# Patient Record
Sex: Male | Born: 1970 | Race: Black or African American | Hispanic: No | Marital: Single | Smoking: Never smoker
Health system: Southern US, Community
[De-identification: ages and names within clinical notes are randomized; demographics above are authoritative.]

## PROBLEM LIST (undated history)

## (undated) DIAGNOSIS — E119 Type 2 diabetes mellitus without complications: Secondary | ICD-10-CM

## (undated) DIAGNOSIS — I10 Essential (primary) hypertension: Secondary | ICD-10-CM

---

## 2017-06-18 ENCOUNTER — Other Ambulatory Visit: Payer: Self-pay

## 2017-06-18 ENCOUNTER — Emergency Department (HOSPITAL_COMMUNITY): Payer: Managed Care, Other (non HMO)

## 2017-06-18 ENCOUNTER — Encounter (HOSPITAL_COMMUNITY): Payer: Self-pay | Admitting: Nurse Practitioner

## 2017-06-18 ENCOUNTER — Emergency Department (HOSPITAL_COMMUNITY)
Admission: EM | Admit: 2017-06-18 | Discharge: 2017-06-18 | Disposition: A | Discharge status: Home / Self Care | Payer: Managed Care, Other (non HMO) | Attending: Emergency Medicine | Admitting: Emergency Medicine

## 2017-06-18 DIAGNOSIS — S99911A Unspecified injury of right ankle, initial encounter: Secondary | ICD-10-CM | POA: Diagnosis present

## 2017-06-18 DIAGNOSIS — Y929 Unspecified place or not applicable: Secondary | ICD-10-CM | POA: Diagnosis not present

## 2017-06-18 DIAGNOSIS — Y999 Unspecified external cause status: Secondary | ICD-10-CM | POA: Diagnosis not present

## 2017-06-18 DIAGNOSIS — S82851A Displaced trimalleolar fracture of right lower leg, initial encounter for closed fracture: Secondary | ICD-10-CM | POA: Insufficient documentation

## 2017-06-18 DIAGNOSIS — Y939 Activity, unspecified: Secondary | ICD-10-CM | POA: Diagnosis not present

## 2017-06-18 DIAGNOSIS — W010XXA Fall on same level from slipping, tripping and stumbling without subsequent striking against object, initial encounter: Secondary | ICD-10-CM | POA: Insufficient documentation

## 2017-06-18 DIAGNOSIS — R3129 Other microscopic hematuria: Secondary | ICD-10-CM | POA: Insufficient documentation

## 2017-06-18 DIAGNOSIS — S9304XA Dislocation of right ankle joint, initial encounter: Secondary | ICD-10-CM

## 2017-06-18 DIAGNOSIS — S82899A Other fracture of unspecified lower leg, initial encounter for closed fracture: Secondary | ICD-10-CM

## 2017-06-18 LAB — URINALYSIS, ROUTINE W REFLEX MICROSCOPIC
BILIRUBIN URINE: NEGATIVE
Bacteria, UA: NONE SEEN
GLUCOSE, UA: NEGATIVE mg/dL
KETONES UR: NEGATIVE mg/dL
LEUKOCYTES UA: NEGATIVE
NITRITE: NEGATIVE
PROTEIN: NEGATIVE mg/dL
Specific Gravity, Urine: 1.02 (ref 1.005–1.030)
Squamous Epithelial / LPF: NONE SEEN
pH: 7 (ref 5.0–8.0)

## 2017-06-18 MED ORDER — PROPOFOL 10 MG/ML IV BOLUS
INTRAVENOUS | Status: AC
  Filled 2017-06-18: qty 20

## 2017-06-18 MED ORDER — ONDANSETRON HCL 4 MG/2ML IJ SOLN
4.0000 mg | Freq: Once | INTRAMUSCULAR | Status: AC
  Administered 2017-06-18: 4 mg via INTRAVENOUS
  Filled 2017-06-18: qty 2

## 2017-06-18 MED ORDER — HYDROCODONE-ACETAMINOPHEN 5-325 MG PO TABS: 1.0000 | ORAL_TABLET | Freq: Four times a day (QID) | ORAL | 0 refills | Status: AC | PRN

## 2017-06-18 MED ORDER — HYDROMORPHONE HCL 1 MG/ML IJ SOLN
1.0000 mg | Freq: Once | INTRAMUSCULAR | Status: AC
  Administered 2017-06-18: 1 mg via INTRAVENOUS
  Filled 2017-06-18: qty 1

## 2017-06-18 MED ORDER — PROPOFOL 10 MG/ML IV BOLUS
INTRAVENOUS | Status: AC | PRN
  Administered 2017-06-18 (×2): 60 mg via INTRAVENOUS

## 2017-06-18 MED ORDER — SODIUM CHLORIDE 0.9 % IV SOLN
INTRAVENOUS | Status: AC | PRN
  Administered 2017-06-18: 1000 mL via INTRAVENOUS

## 2017-06-18 MED ORDER — PROPOFOL 10 MG/ML IV BOLUS: 0.5000 mg/kg | Freq: Once | INTRAVENOUS | Status: DC

## 2017-06-18 NOTE — ED Triage Notes (Signed)
Patient brought in by EMS for right ankle deformity. He was walking in his socks and slipped. Patient did not hit his head and no LOC.

## 2017-06-18 NOTE — ED Notes (Signed)
Patient urinated using the urinal. This nurse noted blood coming from patient urethra. Patient's urine is pink tinged. EDMD notified. UA ordered.

## 2017-06-18 NOTE — ED Provider Notes (Addendum)
[facility]-EMERGENCY DEPT Provider Note   CSN: [id number] Arrival date & time: 06/18/17  [id number]     History   Chief Complaint No chief complaint on file.   HPI Benjamin Henderson is a 46 y.o. male.  Patient c/o skip and fall with pain in right ankle. Occurred this AM.  No faintness or dizziness prior to fall. Right ankle pain is constant, dull, severe, worse w movement. No knee pain. Denies any other pain or injury. Skin is intact. Denies numbness/weakness. Felt fine prior to fall.    The history is provided by the patient.    History reviewed. No pertinent past medical history.  There are no active problems to display for this patient.   History reviewed. No pertinent surgical history.     Home Medications    Prior to Admission medications   Not on File    Family History History reviewed. No pertinent family history.  Social History Social History   Tobacco Use  . Smoking status: Not on file  Substance Use Topics  . Alcohol use: Not on file  . Drug use: Not on file     Allergies   Patient has no allergy information on record.   Review of Systems Review of Systems  Constitutional: Negative for fever.  HENT: Negative for nosebleeds.   Eyes: Negative for redness.  Respiratory: Negative for shortness of breath.   Cardiovascular: Negative for chest pain.  Gastrointestinal: Negative for abdominal pain and vomiting.  Genitourinary: Negative for dysuria, flank pain, hematuria, scrotal swelling and testicular pain.  Musculoskeletal: Negative for back pain and neck pain.  Skin: Negative for wound.  Neurological: Negative for weakness, numbness and headaches.  Hematological: Does not bruise/bleed easily.  Psychiatric/Behavioral: Negative for confusion.     Physical Exam Updated Vital Signs BP 135/70 (BP Location: Right Arm)   Pulse (!) 104   Temp 98.5 F (36.9 C) (Oral)   Resp 18   SpO2 99%   Physical Exam  Constitutional: He  appears well-developed and well-nourished. No distress.  HENT:  Head: Atraumatic.  Mouth/Throat: Oropharynx is clear and moist.  Eyes: Conjunctivae are normal. Pupils are equal, round, and reactive to light.  Neck: Neck supple. No tracheal deviation present.  Cardiovascular: Normal rate, regular rhythm, normal heart sounds and intact distal pulses.  Pulmonary/Chest: Effort normal and breath sounds normal. No accessory muscle usage. No respiratory distress. He exhibits no tenderness.  Abdominal: Soft. Bowel sounds are normal. He exhibits no distension. There is no tenderness.  No abd or flank bruising or contusion.   Genitourinary:  Genitourinary Comments: No cva tenderness  Musculoskeletal: He exhibits no edema.  Neurological: He is alert.  Speech clear/fluent. Motor intact bil. sens grossly intact bil.   Skin: Skin is warm and dry. He is not diaphoretic.  Psychiatric: He has a normal mood and affect.  Nursing note and vitals reviewed.    ED Treatments / Results  Labs (all labs ordered are listed, but only abnormal results are displayed) Results for orders placed or performed during the hospital encounter of 06/18/17  Urinalysis, Routine w reflex microscopic  Result Value Ref Range   Color, Urine YELLOW YELLOW   APPearance CLEAR CLEAR   Specific Gravity, Urine 1.020 1.005 - 1.030   pH 7.0 5.0 - 8.0   Glucose, UA NEGATIVE NEGATIVE mg/dL   Hgb urine dipstick SMALL (A) NEGATIVE   Bilirubin Urine NEGATIVE NEGATIVE   Ketones, ur NEGATIVE NEGATIVE mg/dL   Protein, ur NEGATIVE NEGATIVE  mg/dL   Nitrite NEGATIVE NEGATIVE   Leukocytes, UA NEGATIVE NEGATIVE   RBC / HPF TOO NUMEROUS TO COUNT 0 - 5 RBC/hpf   WBC, UA 0-5 0 - 5 WBC/hpf   Bacteria, UA NONE SEEN NONE SEEN   Squamous Epithelial / LPF NONE SEEN NONE SEEN   Mucus PRESENT    Dg Ankle Complete Right  Result Date: 06/18/2017 CLINICAL DATA:  Pain following fall EXAM: RIGHT ANKLE - COMPLETE 3+ VIEW COMPARISON:  None. FINDINGS:  Frontal, oblique, and lateral views were obtained. There is gross disruption of the ankle mortise with the foot displaced posterior and lateral to the tibial plafond. There is a comminuted fracture of the distal fibular diaphysis with lateral displacement and posterior angulation of the distal major fracture fragment with respect proximal fragment. There is avulsion along the medial malleolus. There are posterior inferior calcaneal spurs. There is spurring in the dorsal midfoot region. IMPRESSION: Comminuted fracture of the distal fibula with displacement angulation of distal major fragments. Avulsion medial malleolus. Gross ankle mortise disruption. 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 Electronically Signed   By: Bretta Bang III M.D.   On: 06/18/2017 10:36   Ct Ankle Right Wo Contrast  Result Date: 06/18/2017 CLINICAL DATA:  Ankle fracture dislocation post closed reduction. EXAM: CT OF THE RIGHT ANKLE WITHOUT CONTRAST TECHNIQUE: Multidetector CT imaging of the right ankle was performed according to the standard protocol. Multiplanar CT image reconstructions were also generated. COMPARISON:  Radiographs same date. FINDINGS: Bones/Joint/Cartilage The ankle remains splinted. There is a comminuted and minimally displaced fracture of the distal fibula extending superiorly from the ankle mortise by 5.2 cm. This demonstrates up to 5 mm of posterior displacement. There is a mildly comminuted intra-articular fracture of the tibial plafond posteriorly. This demonstrates up to 7 mm of posterior displacement medially and up to 4 mm of impaction of the articular surface posteriorly (sagittal image 53). There is spurring of the medial malleolus without definite acute fracture. The tibiotalar dislocation has been reduced. No tarsal bone fractures are seen. There are several small fracture fragments in the tibiotalar joint. There is a small ankle joint  effusion. Midfoot degenerative changes are present, greatest at the articulation between the cuboid and lateral malleolus. Ligaments Suboptimally evaluated by CT. Possible small avulsion fractures mediated by the anterior inferior tibiofibular ligament. Muscles and Tendons The ankle tendons appear intact without evidence of entrapment in the fractures. Moderate posterior calcaneal spurring at the Achilles insertion. Soft tissues Mild soft tissue swelling around the fractures, extending into the foot. IMPRESSION: 1. Near anatomic reduction of comminuted fracture of the distal fibula and posterior aspect of the tibial plafond as described. Several fracture fragments are present in the tibiotalar joint. 2. No evidence of tarsal bone fracture. 3. Moderate midfoot degenerative changes. Electronically Signed   By: Carey Bullocks M.D.   On: 06/18/2017 12:38   Dg Ankle Right Port  Result Date: 06/18/2017 CLINICAL DATA:  Post reduction EXAM: PORTABLE RIGHT ANKLE - 2 VIEW COMPARISON:  Earlier same day FINDINGS: Two views through a plaster splint show reduction of the fracture dislocation at the ankle joint. Alignment of the fragments is near anatomic. IMPRESSION: Restoration of near anatomic alignment. Electronically Signed   By: Paulina Fusi M.D.   On: 06/18/2017 12:02    EKG  EKG Interpretation None       Radiology Dg Ankle Complete Right  Result Date: 06/18/2017 CLINICAL DATA:  Pain following fall  EXAM: RIGHT ANKLE - COMPLETE 3+ VIEW COMPARISON:  None. FINDINGS: Frontal, oblique, and lateral views were obtained. There is gross disruption of the ankle mortise with the foot displaced posterior and lateral to the tibial plafond. There is a comminuted fracture of the distal fibular diaphysis with lateral displacement and posterior angulation of the distal major fracture fragment with respect proximal fragment. There is avulsion along the medial malleolus. There are posterior inferior calcaneal spurs. There  is spurring in the dorsal midfoot region. IMPRESSION: Comminuted fracture of the distal fibula with displacement angulation of distal major fragments. Avulsion medial malleolus. Gross ankle mortise disruption. 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 Electronically Signed   By: Bretta Bang III M.D.   On: 06/18/2017 10:36   Ct Ankle Right Wo Contrast  Result Date: 06/18/2017 CLINICAL DATA:  Ankle fracture dislocation post closed reduction. EXAM: CT OF THE RIGHT ANKLE WITHOUT CONTRAST TECHNIQUE: Multidetector CT imaging of the right ankle was performed according to the standard protocol. Multiplanar CT image reconstructions were also generated. COMPARISON:  Radiographs same date. FINDINGS: Bones/Joint/Cartilage The ankle remains splinted. There is a comminuted and minimally displaced fracture of the distal fibula extending superiorly from the ankle mortise by 5.2 cm. This demonstrates up to 5 mm of posterior displacement. There is a mildly comminuted intra-articular fracture of the tibial plafond posteriorly. This demonstrates up to 7 mm of posterior displacement medially and up to 4 mm of impaction of the articular surface posteriorly (sagittal image 53). There is spurring of the medial malleolus without definite acute fracture. The tibiotalar dislocation has been reduced. No tarsal bone fractures are seen. There are several small fracture fragments in the tibiotalar joint. There is a small ankle joint effusion. Midfoot degenerative changes are present, greatest at the articulation between the cuboid and lateral malleolus. Ligaments Suboptimally evaluated by CT. Possible small avulsion fractures mediated by the anterior inferior tibiofibular ligament. Muscles and Tendons The ankle tendons appear intact without evidence of entrapment in the fractures. Moderate posterior calcaneal spurring at the Achilles insertion. Soft tissues Mild soft tissue  swelling around the fractures, extending into the foot. IMPRESSION: 1. Near anatomic reduction of comminuted fracture of the distal fibula and posterior aspect of the tibial plafond as described. Several fracture fragments are present in the tibiotalar joint. 2. No evidence of tarsal bone fracture. 3. Moderate midfoot degenerative changes. Electronically Signed   By: Carey Bullocks M.D.   On: 06/18/2017 12:38   Dg Ankle Right Port  Result Date: 06/18/2017 CLINICAL DATA:  Post reduction EXAM: PORTABLE RIGHT ANKLE - 2 VIEW COMPARISON:  Earlier same day FINDINGS: Two views through a plaster splint show reduction of the fracture dislocation at the ankle joint. Alignment of the fragments is near anatomic. IMPRESSION: Restoration of near anatomic alignment. Electronically Signed   By: Paulina Fusi M.D.   On: 06/18/2017 12:02    Procedures .Sedation Date/Time: 06/18/2017 11:32 AM Performed by: Cathren Laine, MD Authorized by: Cathren Laine, MD   Consent:    Consent obtained:  Verbal and written   Consent given by:  Patient Universal protocol:    Immediately prior to procedure a time out was called: yes   Indications:    Procedure performed:  Fracture reduction   Procedure necessitating sedation performed by:  Physician performing sedation   Intended level of sedation:  Moderate (conscious sedation) Pre-sedation assessment:    Time since last food or drink:  4   ASA classification:  class 2 - patient with mild systemic disease     Neck mobility: normal     Mouth opening:  3 or more finger widths   Mallampati score:  II - soft palate, uvula, fauces visible   Pre-sedation assessments completed and reviewed: airway patency, cardiovascular function, mental status and respiratory function   Immediate pre-procedure details:    Reassessment: Patient reassessed immediately prior to procedure     Reviewed: vital signs     Verified: bag valve mask available, emergency equipment available, intubation  equipment available, IV patency confirmed, oxygen available and suction available   Procedure details (see MAR for exact dosages):    Preoxygenation:  Nasal cannula   Sedation:  Propofol   Intra-procedure monitoring:  Continuous capnometry, cardiac monitor, blood pressure monitoring, continuous pulse oximetry, frequent LOC assessments and frequent vital sign checks   Intra-procedure events: none     Total Provider sedation time (minutes):  20 Post-procedure details:    Post-sedation assessment completed:  06/18/2017 11:34 AM   Attendance: Constant attendance by certified staff until patient recovered     Recovery: Patient returned to pre-procedure baseline     Post-sedation assessments completed and reviewed: airway patency, cardiovascular function, mental status and pain level     Patient tolerance:  Tolerated well, no immediate complications   (including critical care time)  Medications Ordered in ED Medications  HYDROmorphone (DILAUDID) injection 1 mg (1 mg Intravenous Given 06/18/17 1008)  ondansetron (ZOFRAN) injection 4 mg (4 mg Intravenous Given 06/18/17 1008)     Initial Impression / Assessment and Plan / ED Course  I have reviewed the triage vital signs and the nursing notes.  Pertinent labs & imaging results that were available during my care of the patient were reviewed by me and considered in my medical decision making (see chart for details).  Iv ns. Dilaudid 1 mg iv.  Elevate ankle/foot. icepack to sore area.  Xrays.  Reviewed nursing notes and prior charts for additional history.   Procedural sedation w propofol.    Ankle dislocation reduced.   Dp/pt 2+ post reduction and splinting.   Pain controlled.  Discussed with Dr Roda ShuttersXu - he requests CT and f/u Monday.  Recheck, pain controlled, distal pulses palp.   Microscopic blood on ua.  No abd or flank pain, tenderness, or contusion. Pt affirms w fall, no abd or back trauma.  Patient has no dysuria, urgency, or  other gu c/o. No fever or chills. Normal external gu exam.  Still no abd or flank pain or tenderness on recheck. Will have f/u outpt - discussed need to f/u as outpatient with patient/fam - pt voices understanding. Return precautions also discussed.   Patient currently appears stable for d/c.     Final Clinical Impressions(s) / ED Diagnoses   Final diagnoses:  None    ED Discharge Orders    None            Cathren LaineSteinl, Verdene Creson, MD 06/18/17 1335

## 2017-06-18 NOTE — ED Notes (Signed)
Patient prepared for conscious sedation. Consents signed. Vital signs cycling. Patient on CO2 monitor and tele leads. Fluids infusing KVO into patient IV. Ambu-bag and suction set up at patient bedside. Ortho tech called. Respiratory called.

## 2017-06-18 NOTE — Procedures (Signed)
Procedure: Right ankle closed reduction  Indication: Right ankle fracture/dislocation  Surgeon: Charma IgoMichael Adrionna Delcid, PA-C  Assist: None  Anesthesia: Conscious sedation by EDP  EBL: None  Complications: None  Findings: After risks/benefits explained patient desires to undergo procedure. Consent obtained and time out performed. After sedation ankle was relocated and splinted. X-rays pending.    Freeman CaldronMichael J. Krystena Reitter, PA-C Orthopedic Surgery [phone]

## 2017-06-18 NOTE — Consult Note (Signed)
Reason for Consult:Ankle fx Referring Physician: Kirtland BouchardK Maryelizabeth KaufmannSteinl  Benjamin Henderson is an 46 y.o. male.  HPI: Benjamin Henderson was bringing the trash barrel in this morning and slipped on the wet ground. He had immediate pain in his ankle and could not get up. He crawled into the house and called 911. X-rays showed a displaced trimal fx of the ankle and orthopedic surgery was consulted.  History reviewed. No pertinent past medical history.  History reviewed. No pertinent surgical history.  History reviewed. No pertinent family history.  Social History:  has no tobacco, alcohol, and drug history on file.  Allergies: No Known Allergies  Medications: I have reviewed the patient's current medications.  Results for orders placed or performed during the hospital encounter of 06/18/17 (from the past 48 hour(s))  Urinalysis, Routine w reflex microscopic     Status: Abnormal   Collection Time: 06/18/17 10:08 AM  Result Value Ref Range   Color, Urine YELLOW YELLOW   APPearance CLEAR CLEAR   Specific Gravity, Urine 1.020 1.005 - 1.030   pH 7.0 5.0 - 8.0   Glucose, UA NEGATIVE NEGATIVE mg/dL   Hgb urine dipstick SMALL (A) NEGATIVE   Bilirubin Urine NEGATIVE NEGATIVE   Ketones, ur NEGATIVE NEGATIVE mg/dL   Protein, ur NEGATIVE NEGATIVE mg/dL   Nitrite NEGATIVE NEGATIVE   Leukocytes, UA NEGATIVE NEGATIVE   RBC / HPF TOO NUMEROUS TO COUNT 0 - 5 RBC/hpf   WBC, UA 0-5 0 - 5 WBC/hpf   Bacteria, UA NONE SEEN NONE SEEN   Squamous Epithelial / LPF NONE SEEN NONE SEEN   Mucus PRESENT     Dg Ankle Complete Right  Result Date: 06/18/2017 CLINICAL DATA:  Pain following fall EXAM: RIGHT ANKLE - COMPLETE 3+ VIEW COMPARISON:  None. FINDINGS: Frontal, oblique, and lateral views were obtained. There is gross disruption of the ankle mortise with the foot displaced posterior and lateral to the tibial plafond. There is a comminuted fracture of the distal fibular diaphysis with lateral displacement and posterior angulation of the  distal major fracture fragment with respect proximal fragment. There is avulsion along the medial malleolus. There are posterior inferior calcaneal spurs. There is spurring in the dorsal midfoot region. IMPRESSION: Comminuted fracture of the distal fibula with displacement angulation of distal major fragments. Avulsion medial malleolus. Gross ankle mortise disruption. 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 Electronically Signed   By: Bretta BangWilliam  Woodruff III M.D.   On: 06/18/2017 10:36    Review of Systems  Constitutional: Negative for weight loss.  HENT: Negative for ear discharge, ear pain, hearing loss and tinnitus.   Eyes: Negative for blurred vision, double vision, photophobia and pain.  Respiratory: Negative for cough, sputum production and shortness of breath.   Cardiovascular: Negative for chest pain.  Gastrointestinal: Negative for abdominal pain, nausea and vomiting.  Genitourinary: Negative for dysuria, flank pain, frequency and urgency.  Musculoskeletal: Positive for joint pain (Right ankle). Negative for back pain, falls, myalgias and neck pain.  Neurological: Negative for dizziness, tingling, sensory change, focal weakness, loss of consciousness and headaches.  Endo/Heme/Allergies: Does not bruise/bleed easily.  Psychiatric/Behavioral: Negative for depression, memory loss and substance abuse. The patient is not nervous/anxious.    Blood pressure 131/71, pulse 99, temperature 98.5 F (36.9 C), temperature source Oral, resp. rate (!) 32, height 5\' 9"  (1.753 m), weight 113.4 kg (250 lb), SpO2 100 %. Physical Exam  Constitutional: He appears well-developed and well-nourished. No distress.  HENT:  Head: Normocephalic.  Eyes: Conjunctivae are normal. Right eye exhibits no discharge. Left eye exhibits no discharge. No scleral icterus.  Neck: Normal range of motion.  Cardiovascular: Normal rate and regular rhythm.  Respiratory:  Effort normal. No respiratory distress.  Musculoskeletal:  RLE No traumatic wounds, ecchymosis, or rash  Ankle severe TTP, edematous, deformed  No knee effusion  Knee stable to varus/ valgus and anterior/posterior stress  Sens DPN, SPN, TN intact  Motor EHL, ext, flex, evers 5/5  DP 2+, PT 0, No significant edema  Neurological: He is alert.  Skin: Skin is warm and dry. He is not diaphoretic.  Psychiatric: He has a normal mood and affect. His behavior is normal.    Assessment/Plan: Fall Right trimal ankle fx -- Underwent CR, will get CT and then D/C to home with plans to f/u with Dr. Roda ShuttersXu on Monday for discussion of delayed ORIF. Elevation and icing and NWB until then.    Freeman CaldronMichael J. Jeslyn Amsler, PA-C Orthopedic Surgery [phone] 06/18/2017, 11:34 AM

## 2017-06-18 NOTE — Discharge Instructions (Addendum)
Keep splint intact and dry. Keep foot elevated whenever possible.   Ice to ankle for 30 minutes 4 times a day.  Do not put any weight down on right foot.  Use crutches.   Take motrin or aleve as need for pain. You may also take hydrocodone as need for pain. No driving for the next 6 hours or when taking hydrocodone. Also, do not take tylenol or acetaminophen containing medication when taking hydrocodone.  For ankle fracture, follow up with Dr Roda ShuttersXu in his office this Monday morning - call office now to arrange appointment time.  For blood in urine, follow up with primary care doctor for recheck of urine in the coming week.   Return to ER if worse, new symptoms, new or severe pain, persistent vomiting, severe abdominal or flank pain, trouble voiding, other concern.

## 2017-06-18 NOTE — ED Notes (Signed)
Bed: WA03 Expected date:  Expected time:  Means of arrival:  Comments: 46 yo m, ankle injury

## 2017-06-20 ENCOUNTER — Encounter (INDEPENDENT_AMBULATORY_CARE_PROVIDER_SITE_OTHER): Payer: Self-pay | Admitting: Orthopaedic Surgery

## 2017-06-20 DIAGNOSIS — S82851D Displaced trimalleolar fracture of right lower leg, subsequent encounter for closed fracture with routine healing: Secondary | ICD-10-CM | POA: Insufficient documentation

## 2017-06-21 ENCOUNTER — Encounter (HOSPITAL_BASED_OUTPATIENT_CLINIC_OR_DEPARTMENT_OTHER): Payer: Self-pay | Admitting: *Deleted

## 2017-06-21 ENCOUNTER — Encounter (HOSPITAL_BASED_OUTPATIENT_CLINIC_OR_DEPARTMENT_OTHER)
Admission: RE | Admit: 2017-06-21 | Discharge: 2017-06-21 | Disposition: A | Discharge status: Home / Self Care | Payer: Managed Care, Other (non HMO) | Source: Ambulatory Visit | Attending: Orthopaedic Surgery | Admitting: Orthopaedic Surgery

## 2017-06-21 ENCOUNTER — Ambulatory Visit (INDEPENDENT_AMBULATORY_CARE_PROVIDER_SITE_OTHER): Payer: Self-pay | Admitting: Orthopaedic Surgery

## 2017-06-21 ENCOUNTER — Other Ambulatory Visit: Payer: Self-pay

## 2017-06-21 ENCOUNTER — Other Ambulatory Visit (HOSPITAL_BASED_OUTPATIENT_CLINIC_OR_DEPARTMENT_OTHER): Payer: Managed Care, Other (non HMO)

## 2017-06-21 DIAGNOSIS — Z79899 Other long term (current) drug therapy: Secondary | ICD-10-CM | POA: Diagnosis not present

## 2017-06-21 DIAGNOSIS — I10 Essential (primary) hypertension: Secondary | ICD-10-CM | POA: Diagnosis not present

## 2017-06-21 DIAGNOSIS — Z01812 Encounter for preprocedural laboratory examination: Secondary | ICD-10-CM | POA: Diagnosis not present

## 2017-06-21 DIAGNOSIS — Z01810 Encounter for preprocedural cardiovascular examination: Secondary | ICD-10-CM | POA: Diagnosis not present

## 2017-06-21 DIAGNOSIS — X58XXXA Exposure to other specified factors, initial encounter: Secondary | ICD-10-CM | POA: Diagnosis not present

## 2017-06-21 DIAGNOSIS — Z7984 Long term (current) use of oral hypoglycemic drugs: Secondary | ICD-10-CM | POA: Diagnosis not present

## 2017-06-21 DIAGNOSIS — S82851A Displaced trimalleolar fracture of right lower leg, initial encounter for closed fracture: Secondary | ICD-10-CM | POA: Diagnosis not present

## 2017-06-21 DIAGNOSIS — E119 Type 2 diabetes mellitus without complications: Secondary | ICD-10-CM | POA: Diagnosis not present

## 2017-06-21 LAB — BASIC METABOLIC PANEL
ANION GAP: 8 (ref 5–15)
BUN: 16 mg/dL (ref 6–20)
CALCIUM: 9.6 mg/dL (ref 8.9–10.3)
CO2: 30 mmol/L (ref 22–32)
Chloride: 98 mmol/L — ABNORMAL LOW (ref 101–111)
Creatinine, Ser: 0.75 mg/dL (ref 0.61–1.24)
Glucose, Bld: 115 mg/dL — ABNORMAL HIGH (ref 65–99)
POTASSIUM: 4.5 mmol/L (ref 3.5–5.1)
SODIUM: 136 mmol/L (ref 135–145)

## 2017-06-21 NOTE — Progress Notes (Signed)
Pt to come today for BMET and EKG. Bring all medications.

## 2017-06-23 ENCOUNTER — Ambulatory Visit (HOSPITAL_BASED_OUTPATIENT_CLINIC_OR_DEPARTMENT_OTHER): Payer: Managed Care, Other (non HMO) | Admitting: Anesthesiology

## 2017-06-23 ENCOUNTER — Ambulatory Visit (HOSPITAL_BASED_OUTPATIENT_CLINIC_OR_DEPARTMENT_OTHER)
Admission: RE | Admit: 2017-06-23 | Discharge: 2017-06-23 | Disposition: A | Discharge status: Home / Self Care | Payer: Managed Care, Other (non HMO) | Source: Ambulatory Visit | Attending: Orthopaedic Surgery | Admitting: Orthopaedic Surgery

## 2017-06-23 ENCOUNTER — Other Ambulatory Visit: Payer: Self-pay

## 2017-06-23 ENCOUNTER — Ambulatory Visit (HOSPITAL_COMMUNITY): Payer: Managed Care, Other (non HMO)

## 2017-06-23 ENCOUNTER — Encounter (HOSPITAL_BASED_OUTPATIENT_CLINIC_OR_DEPARTMENT_OTHER): Payer: Self-pay | Admitting: *Deleted

## 2017-06-23 ENCOUNTER — Encounter (HOSPITAL_BASED_OUTPATIENT_CLINIC_OR_DEPARTMENT_OTHER)
Admission: RE | Disposition: A | Discharge status: Home / Self Care | Payer: Self-pay | Source: Ambulatory Visit | Attending: Orthopaedic Surgery

## 2017-06-23 DIAGNOSIS — X58XXXA Exposure to other specified factors, initial encounter: Secondary | ICD-10-CM | POA: Insufficient documentation

## 2017-06-23 DIAGNOSIS — Z79899 Other long term (current) drug therapy: Secondary | ICD-10-CM | POA: Insufficient documentation

## 2017-06-23 DIAGNOSIS — Z7984 Long term (current) use of oral hypoglycemic drugs: Secondary | ICD-10-CM | POA: Insufficient documentation

## 2017-06-23 DIAGNOSIS — S82851A Displaced trimalleolar fracture of right lower leg, initial encounter for closed fracture: Secondary | ICD-10-CM | POA: Diagnosis not present

## 2017-06-23 DIAGNOSIS — Z01810 Encounter for preprocedural cardiovascular examination: Secondary | ICD-10-CM | POA: Insufficient documentation

## 2017-06-23 DIAGNOSIS — Z4789 Encounter for other orthopedic aftercare: Secondary | ICD-10-CM

## 2017-06-23 DIAGNOSIS — E119 Type 2 diabetes mellitus without complications: Secondary | ICD-10-CM | POA: Insufficient documentation

## 2017-06-23 DIAGNOSIS — I10 Essential (primary) hypertension: Secondary | ICD-10-CM | POA: Insufficient documentation

## 2017-06-23 DIAGNOSIS — Z01812 Encounter for preprocedural laboratory examination: Secondary | ICD-10-CM | POA: Insufficient documentation

## 2017-06-23 HISTORY — DX: Type 2 diabetes mellitus without complications: E11.9

## 2017-06-23 HISTORY — DX: Essential (primary) hypertension: I10

## 2017-06-23 HISTORY — PX: ORIF ANKLE FRACTURE: SHX5408

## 2017-06-23 LAB — GLUCOSE, CAPILLARY
Glucose-Capillary: 112 mg/dL — ABNORMAL HIGH (ref 65–99)
Glucose-Capillary: 123 mg/dL — ABNORMAL HIGH (ref 65–99)

## 2017-06-23 SURGERY — OPEN REDUCTION INTERNAL FIXATION (ORIF) ANKLE FRACTURE
Anesthesia: General | Site: Ankle | Laterality: Right

## 2017-06-23 MED ORDER — MEPERIDINE HCL 25 MG/ML IJ SOLN: 6.2500 mg | INTRAMUSCULAR | Status: DC | PRN

## 2017-06-23 MED ORDER — ONDANSETRON HCL 4 MG/2ML IJ SOLN
INTRAMUSCULAR | Status: DC | PRN
  Administered 2017-06-23: 4 mg via INTRAVENOUS

## 2017-06-23 MED ORDER — LACTATED RINGERS IV SOLN
INTRAVENOUS | Status: DC
  Administered 2017-06-23 (×2): via INTRAVENOUS

## 2017-06-23 MED ORDER — HYDROMORPHONE HCL 1 MG/ML IJ SOLN: 0.2500 mg | INTRAMUSCULAR | Status: DC | PRN

## 2017-06-23 MED ORDER — OXYCODONE HCL 5 MG PO TABS: 5.0000 mg | ORAL_TABLET | Freq: Once | ORAL | Status: DC | PRN

## 2017-06-23 MED ORDER — SCOPOLAMINE 1 MG/3DAYS TD PT72: 1.0000 | MEDICATED_PATCH | Freq: Once | TRANSDERMAL | Status: DC | PRN

## 2017-06-23 MED ORDER — CEFAZOLIN SODIUM-DEXTROSE 2-4 GM/100ML-% IV SOLN
2.0000 g | INTRAVENOUS | Status: AC
  Administered 2017-06-23: 2 g via INTRAVENOUS

## 2017-06-23 MED ORDER — LACTATED RINGERS IV SOLN
INTRAVENOUS | Status: DC
  Administered 2017-06-23: 10:00:00 via INTRAVENOUS

## 2017-06-23 MED ORDER — DEXAMETHASONE SODIUM PHOSPHATE 10 MG/ML IJ SOLN
INTRAMUSCULAR | Status: DC | PRN
  Administered 2017-06-23: 5 mg via INTRAVENOUS

## 2017-06-23 MED ORDER — CHLORHEXIDINE GLUCONATE 4 % EX LIQD: 60.0000 mL | Freq: Once | CUTANEOUS | Status: DC

## 2017-06-23 MED ORDER — OXYCODONE-ACETAMINOPHEN 5-325 MG PO TABS
1.0000 | ORAL_TABLET | ORAL | 0 refills | Status: AC | PRN
Start: 2017-06-23 — End: ?

## 2017-06-23 MED ORDER — FENTANYL CITRATE (PF) 100 MCG/2ML IJ SOLN
INTRAMUSCULAR | Status: AC
  Filled 2017-06-23: qty 2

## 2017-06-23 MED ORDER — FENTANYL CITRATE (PF) 100 MCG/2ML IJ SOLN
50.0000 ug | INTRAMUSCULAR | Status: AC | PRN
  Administered 2017-06-23 (×2): 25 ug via INTRAVENOUS
  Administered 2017-06-23: 100 ug via INTRAVENOUS

## 2017-06-23 MED ORDER — TIZANIDINE HCL 4 MG PO TABS: 4.0000 mg | ORAL_TABLET | Freq: Four times a day (QID) | ORAL | 2 refills | Status: AC | PRN

## 2017-06-23 MED ORDER — PROMETHAZINE HCL 25 MG/ML IJ SOLN: 6.2500 mg | INTRAMUSCULAR | Status: DC | PRN

## 2017-06-23 MED ORDER — ASPIRIN EC 325 MG PO TBEC: 325.0000 mg | DELAYED_RELEASE_TABLET | Freq: Two times a day (BID) | ORAL | 0 refills | Status: AC

## 2017-06-23 MED ORDER — KETOROLAC TROMETHAMINE 30 MG/ML IJ SOLN: 30.0000 mg | Freq: Once | INTRAMUSCULAR | Status: DC | PRN

## 2017-06-23 MED ORDER — EPHEDRINE SULFATE 50 MG/ML IJ SOLN
INTRAMUSCULAR | Status: DC | PRN
  Administered 2017-06-23: 10 mg via INTRAVENOUS

## 2017-06-23 MED ORDER — PROPOFOL 10 MG/ML IV BOLUS
INTRAVENOUS | Status: AC
  Filled 2017-06-23: qty 20

## 2017-06-23 MED ORDER — MIDAZOLAM HCL 2 MG/2ML IJ SOLN
INTRAMUSCULAR | Status: AC
  Filled 2017-06-23: qty 2

## 2017-06-23 MED ORDER — LIDOCAINE 2% (20 MG/ML) 5 ML SYRINGE
INTRAMUSCULAR | Status: DC | PRN
  Administered 2017-06-23: 100 mg via INTRAVENOUS

## 2017-06-23 MED ORDER — PROMETHAZINE HCL 25 MG PO TABS: 25.0000 mg | ORAL_TABLET | Freq: Four times a day (QID) | ORAL | 1 refills | Status: AC | PRN

## 2017-06-23 MED ORDER — OXYCODONE HCL 5 MG/5ML PO SOLN: 5.0000 mg | Freq: Once | ORAL | Status: DC | PRN

## 2017-06-23 MED ORDER — SENNOSIDES-DOCUSATE SODIUM 8.6-50 MG PO TABS: 1.0000 | ORAL_TABLET | Freq: Every evening | ORAL | 1 refills | Status: AC | PRN

## 2017-06-23 MED ORDER — ONDANSETRON HCL 4 MG/2ML IJ SOLN
INTRAMUSCULAR | Status: AC
  Filled 2017-06-23: qty 2

## 2017-06-23 MED ORDER — PROPOFOL 10 MG/ML IV BOLUS
INTRAVENOUS | Status: DC | PRN
  Administered 2017-06-23: 100 mg via INTRAVENOUS
  Administered 2017-06-23: 200 mg via INTRAVENOUS

## 2017-06-23 MED ORDER — MIDAZOLAM HCL 2 MG/2ML IJ SOLN
1.0000 mg | INTRAMUSCULAR | Status: DC | PRN
  Administered 2017-06-23: 2 mg via INTRAVENOUS

## 2017-06-23 MED ORDER — ONDANSETRON HCL 4 MG PO TABS: 4.0000 mg | ORAL_TABLET | Freq: Three times a day (TID) | ORAL | 0 refills | Status: AC | PRN

## 2017-06-23 MED ORDER — CEFAZOLIN SODIUM-DEXTROSE 2-4 GM/100ML-% IV SOLN
INTRAVENOUS | Status: AC
  Filled 2017-06-23: qty 100

## 2017-06-23 SURGICAL SUPPLY — 85 items
BANDAGE ACE 4X5 VEL STRL LF (GAUZE/BANDAGES/DRESSINGS) IMPLANT
BANDAGE ACE 6X5 VEL STRL LF (GAUZE/BANDAGES/DRESSINGS) ×2 IMPLANT
BANDAGE ESMARK 6X9 LF (GAUZE/BANDAGES/DRESSINGS) ×1 IMPLANT
BIT DRILL 3.5X122MM AO FIT (BIT) ×2 IMPLANT
BIT DRILL CANN 2.7 (BIT) ×1
BIT DRILL SRG 2.7XCANN AO CPLG (BIT) ×1 IMPLANT
BIT DRL SRG 2.7XCANN AO CPLNG (BIT) ×1
BLADE HEX COATED 2.75 (ELECTRODE) ×2 IMPLANT
BLADE SURG 15 STRL LF DISP TIS (BLADE) ×2 IMPLANT
BLADE SURG 15 STRL SS (BLADE) ×2
BNDG COHESIVE 6X5 TAN STRL LF (GAUZE/BANDAGES/DRESSINGS) ×2 IMPLANT
BNDG ESMARK 6X9 LF (GAUZE/BANDAGES/DRESSINGS) ×2
BRUSH SCRUB EZ PLAIN DRY (MISCELLANEOUS) ×2 IMPLANT
CANISTER SUCT 1200ML W/VALVE (MISCELLANEOUS) ×2 IMPLANT
COUNTERSINK (MISCELLANEOUS) ×2
COVER BACK TABLE 60X90IN (DRAPES) ×2 IMPLANT
COVER MAYO STAND STRL (DRAPES) IMPLANT
CUFF TOURNIQUET SINGLE 34IN LL (TOURNIQUET CUFF) ×2 IMPLANT
CUFF TOURNIQUET SINGLE 44IN (TOURNIQUET CUFF) ×2 IMPLANT
DECANTER SPIKE VIAL GLASS SM (MISCELLANEOUS) IMPLANT
DRAPE C-ARM 42X72 X-RAY (DRAPES) ×2 IMPLANT
DRAPE C-ARMOR (DRAPES) ×2 IMPLANT
DRAPE EXTREMITY T 121X128X90 (DRAPE) ×2 IMPLANT
DRAPE IMP U-DRAPE 54X76 (DRAPES) ×2 IMPLANT
DRAPE SURG 17X23 STRL (DRAPES) ×4 IMPLANT
DRILL 2.6X122MM WL AO SHAFT (BIT) ×2 IMPLANT
DRSG PAD ABDOMINAL 8X10 ST (GAUZE/BANDAGES/DRESSINGS) ×4 IMPLANT
DURAPREP 26ML APPLICATOR (WOUND CARE) ×2 IMPLANT
ELECT REM PT RETURN 9FT ADLT (ELECTROSURGICAL) ×2
ELECTRODE REM PT RTRN 9FT ADLT (ELECTROSURGICAL) ×1 IMPLANT
GAUZE SPONGE 4X4 12PLY STRL (GAUZE/BANDAGES/DRESSINGS) ×2 IMPLANT
GAUZE XEROFORM 1X8 LF (GAUZE/BANDAGES/DRESSINGS) ×2 IMPLANT
GLOVE SKINSENSE NS SZ7.5 (GLOVE) ×1
GLOVE SKINSENSE STRL SZ7.5 (GLOVE) ×1 IMPLANT
GLOVE SURG SYN 7.5  E (GLOVE) ×1
GLOVE SURG SYN 7.5 E (GLOVE) ×1 IMPLANT
GOWN STRL REIN XL XLG (GOWN DISPOSABLE) ×2 IMPLANT
GOWN STRL REUS W/ TWL LRG LVL3 (GOWN DISPOSABLE) ×1 IMPLANT
GOWN STRL REUS W/TWL LRG LVL3 (GOWN DISPOSABLE) ×1
K-WIRE ORTHOPEDIC 1.4X150L (WIRE) ×4
KWIRE ORTHOPEDIC 1.4X150L (WIRE) ×2 IMPLANT
MANIFOLD NEPTUNE II (INSTRUMENTS) ×2 IMPLANT
NEEDLE HYPO 22GX1.5 SAFETY (NEEDLE) IMPLANT
NS IRRIG 1000ML POUR BTL (IV SOLUTION) ×2 IMPLANT
PACK BASIN DAY SURGERY FS (CUSTOM PROCEDURE TRAY) ×2 IMPLANT
PAD CAST 3X4 CTTN HI CHSV (CAST SUPPLIES) IMPLANT
PAD CAST 4YDX4 CTTN HI CHSV (CAST SUPPLIES) ×1 IMPLANT
PADDING CAST COTTON 3X4 STRL (CAST SUPPLIES)
PADDING CAST COTTON 4X4 STRL (CAST SUPPLIES) ×1
PADDING CAST COTTON 6X4 STRL (CAST SUPPLIES) ×2 IMPLANT
PADDING CAST SYN 6 (CAST SUPPLIES) ×1
PADDING CAST SYNTHETIC 4 (CAST SUPPLIES) ×1
PADDING CAST SYNTHETIC 4X4 STR (CAST SUPPLIES) ×1 IMPLANT
PADDING CAST SYNTHETIC 6X4 NS (CAST SUPPLIES) ×1 IMPLANT
PENCIL BUTTON HOLSTER BLD 10FT (ELECTRODE) ×2 IMPLANT
PLATE FIBULA 4H (Plate) ×2 IMPLANT
SCREW BONE 14MMX3.5MM (Screw) ×2 IMPLANT
SCREW BONE 3.5X20MM (Screw) ×2 IMPLANT
SCREW BONE NON-LCKING 3.5X12MM (Screw) ×4 IMPLANT
SCREW COUNTERSINK (MISCELLANEOUS) ×1 IMPLANT
SCREW LOCK 20MMX3.5 (Screw) ×2 IMPLANT
SCREW LOCKING 3.5X16MM (Screw) ×2 IMPLANT
SCREW LOCKING 3.5X18MM (Screw) ×2 IMPLANT
SCREW NONLOCK 22MM (Screw) ×2 IMPLANT
SHEET MEDIUM DRAPE 40X70 STRL (DRAPES) ×2 IMPLANT
SLEEVE SCD COMPRESS KNEE MED (MISCELLANEOUS) ×2 IMPLANT
SPLINT FIBERGLASS 4X30 (CAST SUPPLIES) ×2 IMPLANT
SPONGE LAP 18X18 X RAY DECT (DISPOSABLE) ×2 IMPLANT
SUCTION FRAZIER HANDLE 10FR (MISCELLANEOUS) ×1
SUCTION TUBE FRAZIER 10FR DISP (MISCELLANEOUS) ×1 IMPLANT
SUT ETHILON 3 0 PS 1 (SUTURE) IMPLANT
SUT VIC AB 0 CT1 27 (SUTURE)
SUT VIC AB 0 CT1 27XBRD ANBCTR (SUTURE) IMPLANT
SUT VIC AB 2-0 CT1 27 (SUTURE) ×2
SUT VIC AB 2-0 CT1 TAPERPNT 27 (SUTURE) ×2 IMPLANT
SUT VIC AB 3-0 SH 27 (SUTURE)
SUT VIC AB 3-0 SH 27X BRD (SUTURE) IMPLANT
SYR BULB 3OZ (MISCELLANEOUS) ×2 IMPLANT
SYR CONTROL 10ML LL (SYRINGE) IMPLANT
TOWEL OR 17X24 6PK STRL BLUE (TOWEL DISPOSABLE) ×2 IMPLANT
TOWEL OR NON WOVEN STRL DISP B (DISPOSABLE) ×2 IMPLANT
TRAY DSU PREP LF (CUSTOM PROCEDURE TRAY) ×2 IMPLANT
TUBE CONNECTING 20X1/4 (TUBING) ×2 IMPLANT
UNDERPAD 30X30 (UNDERPADS AND DIAPERS) ×2 IMPLANT
YANKAUER SUCT BULB TIP NO VENT (SUCTIONS) ×2 IMPLANT

## 2017-06-23 NOTE — Discharge Instructions (Signed)
    1. Keep splint clean and dry 2. Elevate foot above level of the heart 3. Take aspirin to prevent blood clots 4. Take pain meds as needed 5. Strict non weight bearing to operative extremity   Post Anesthesia Home Care Instructions  Activity: Get plenty of rest for the remainder of the day. A responsible individual must stay with you for 24 hours following the procedure.  For the next 24 hours, DO NOT: -Drive a car -Operate machinery -Drink alcoholic beverages -Take any medication unless instructed by your physician -Make any legal decisions or sign important papers.  Meals: Start with liquid foods such as gelatin or soup. Progress to regular foods as tolerated. Avoid greasy, spicy, heavy foods. If nausea and/or vomiting occur, drink only clear liquids until the nausea and/or vomiting subsides. Call your physician if vomiting continues.  Special Instructions/Symptoms: Your throat may feel dry or sore from the anesthesia or the breathing tube placed in your throat during surgery. If this causes discomfort, gargle with warm salt water. The discomfort should disappear within 24 hours.  If you had a scopolamine patch placed behind your ear for the management of post- operative nausea and/or vomiting:  1. The medication in the patch is effective for 72 hours, after which it should be removed.  Wrap patch in a tissue and discard in the trash. Wash hands thoroughly with soap and water. 2. You may remove the patch earlier than 72 hours if you experience unpleasant side effects which may include dry mouth, dizziness or visual disturbances. 3. Avoid touching the patch. Wash your hands with soap and water after contact with the patch.  Regional Anesthesia Blocks  1. Numbness or the inability to move the "blocked" extremity may last from 3-48 hours after placement. The length of time depends on the medication injected and your individual response to the medication. If the numbness is not  going away after 48 hours, call your surgeon.  2. The extremity that is blocked will need to be protected until the numbness is gone and the  Strength has returned. Because you cannot feel it, you will need to take extra care to avoid injury. Because it may be weak, you may have difficulty moving it or using it. You may not know what position it is in without looking at it while the block is in effect.  3. For blocks in the legs and feet, returning to weight bearing and walking needs to be done carefully. You will need to wait until the numbness is entirely gone and the strength has returned. You should be able to move your leg and foot normally before you try and bear weight or walk. You will need someone to be with you when you first try to ensure you do not fall and possibly risk injury.  4. Bruising and tenderness at the needle site are common side effects and will resolve in a few days.  5. Persistent numbness or new problems with movement should be communicated to the surgeon or the [facility] ([phone])/ [facility] ([phone]).      

## 2017-06-23 NOTE — Transfer of Care (Signed)
Immediate Anesthesia Transfer of Care Note  Patient: Benjamin Henderson  Procedure(s) Performed: OPEN REDUCTION INTERNAL FIXATION (ORIF) RIGHT TRIMALLEOLAR ANKLE FRACTURE (Right Ankle)  Patient Location: PACU  Anesthesia Type:GA combined with regional for post-op pain  Level of Consciousness: awake, sedated and patient cooperative  Airway & Oxygen Therapy: Patient Spontanous Breathing and Patient connected to face mask oxygen  Post-op Assessment: Report given to RN and Post -op Vital signs reviewed and stable  Post vital signs: Reviewed and stable  Last Vitals:  Vitals:   06/23/17 1115 06/23/17 1120  BP: 110/66 111/63  Pulse: 82 90  Resp: 18 (!) 22  Temp:    SpO2: 98% 97%    Last Pain:  Vitals:   06/23/17 1012  TempSrc: Oral         Complications: No apparent anesthesia complications

## 2017-06-23 NOTE — Anesthesia Postprocedure Evaluation (Signed)
Anesthesia Post Note  Patient: Benjamin Henderson  Procedure(s) Performed: OPEN REDUCTION INTERNAL FIXATION (ORIF) RIGHT TRIMALLEOLAR ANKLE FRACTURE (Right Ankle)     Anesthesia Post Evaluation  Last Vitals:  Vitals:   06/23/17 1430 06/23/17 1445  BP: (!) 105/59 110/64  Pulse: 86 85  Resp: (!) 22 11  Temp:    SpO2: 98% 100%    Last Pain:  Vitals:   06/23/17 1430  TempSrc:   PainSc: 0-No pain                 Lewie LoronJohn Edem Tiegs

## 2017-06-23 NOTE — Op Note (Signed)
   Date of Surgery: 06/23/2017  INDICATIONS: [name] is a 47 y.o.-year-old male who sustained a right ankle fracture; he was indicated for open reduction and internal fixation due to the displaced nature of the articular fracture and came to the operating room today for this procedure. The patient did consent to the procedure after discussion of the risks and benefits.  PREOPERATIVE DIAGNOSIS: right trimalleolar ankle fracture  POSTOPERATIVE DIAGNOSIS: Same.  PROCEDURE: Open treatment of right ankle fracture with internal fixation. Trimalleolar w/o fixation of posterior malleolus CPT 27822.   SURGEON: N. Glee ArvinMichael Xu, M.D.  ASSIST: Starlyn SkeansMary Lindsey EntiatStanbery, [place]; necessary for the timely completion of procedure and due to complexity of procedure.  ANESTHESIA:  general, regional  TOURNIQUET TIME: less than 60 minutes  IV FLUIDS AND URINE: See anesthesia.  ESTIMATED BLOOD LOSS: minimal mL.  IMPLANTS: [employer]  COMPLICATIONS: None.  DESCRIPTION OF PROCEDURE: The patient was brought to the operating room and placed supine on the operating table.  The patient had been signed prior to the procedure and this was documented. The patient had the anesthesia placed by the anesthesiologist.  A nonsterile tourniquet was placed on the upper thigh.  The prep verification and incision time-outs were performed to confirm that this was the correct patient, site, side and location. The patient had an SCD on the opposite lower extremity. The patient did receive antibiotics prior to the incision and was re-dosed during the procedure as needed at indicated intervals.  The patient had the lower extremity prepped and draped in the standard surgical fashion.  The extremity was exsanguinated using an esmarch bandage and the tourniquet was inflated to 300 mm Hg.  Bilateral incision over the distal fibula was created.  Dissection was carried down to the fibula.  Subperiosteal elevation was then performed.  The  fracture was exposed and the organized hematoma and entrapped periosteum were removed.  Fracture was then reduced and clamped in place.  Fluoroscopy was used to confirm reduction.  An anterior to posterior lag screw was then placed using standard AO technique.  We then placed a precontoured distal fibula plate at the appropriate level using fluoroscopy.  Nonlocking and locking screws were placed through the plate into the fibula using standard AO technique each with excellent purchase.  The ankle mortise was then evaluated and deemed to be stable and congruent with stress exam.  The medial clear space was symmetric.  Posterior malleolus fracture was reduced indirectly into acceptable alignment.  Final x-rays were then taken.  Wound was thoroughly irrigated and closed in layered fashion using 0 Vicryl, 2-0 Vicryl, 3-0 nylon.  Sterile dressings were applied.  Short leg splint was placed.  Patient tolerated procedure well had no immediate complications.  POSTOPERATIVE PLAN: [name] will remain nonweightbearing on this leg for approximately 6 weeks; [name] will return for suture removal in 2 weeks.  He will be immobilized in a short leg splint and then transitioned to a CAM walker at his first follow up appointment.  [name] will receive DVT prophylaxis based on other medications, activity level, and risk ratio of bleeding to thrombosis.  Mayra ReelN. Michael Xu, MD [facility] [phone] 1:29 PM

## 2017-06-23 NOTE — Anesthesia Preprocedure Evaluation (Signed)
Anesthesia Evaluation  Patient identified by MRN, date of birth, ID band Patient awake    Reviewed: Allergy & Precautions, NPO status , Patient's Chart, lab work & pertinent test results  Airway Mallampati: II  TM Distance: >3 FB Neck ROM: Full    Dental no notable dental hx.    Pulmonary neg pulmonary ROS,    Pulmonary exam normal breath sounds clear to auscultation       Cardiovascular hypertension, negative cardio ROS Normal cardiovascular exam Rhythm:Regular Rate:Normal     Neuro/Psych negative neurological ROS  negative psych ROS   GI/Hepatic negative GI ROS, Neg liver ROS,   Endo/Other  diabetes, Type 2  Renal/GU negative Renal ROS     Musculoskeletal negative musculoskeletal ROS (+)   Abdominal (+) + obese,   Peds  Hematology negative hematology ROS (+)   Anesthesia Other Findings   Reproductive/Obstetrics                             Anesthesia Physical Anesthesia Plan  ASA: III  Anesthesia Plan: General   Post-op Pain Management: GA combined w/ Regional for post-op pain   Induction: Intravenous  PONV Risk Score and Plan: 2 and Ondansetron, Midazolam and Treatment may vary due to age or medical condition  Airway Management Planned: LMA  Additional Equipment:   Intra-op Plan:   Post-operative Plan: Extubation in OR  Informed Consent: I have reviewed the patients History and Physical, chart, labs and discussed the procedure including the risks, benefits and alternatives for the proposed anesthesia with the patient or authorized representative who has indicated his/her understanding and acceptance.   Dental advisory given  Plan Discussed with: CRNA  Anesthesia Plan Comments:         Anesthesia Quick Evaluation

## 2017-06-23 NOTE — Anesthesia Procedure Notes (Signed)
Anesthesia Regional Block: Popliteal block   Pre-Anesthetic Checklist: ,, timeout performed, Correct Patient, Correct Site, Correct Laterality, Correct Procedure, Correct Position, site marked, Risks and benefits discussed,  Surgical consent,  Pre-op evaluation,  At surgeon's request and post-op pain management  Laterality: Right  Prep: chloraprep       Needles:  Injection technique: Single-shot  Needle Type: Stimiplex     Needle Length: 10cm  Needle Gauge: 21     Additional Needles:   Procedures:,,,, ultrasound used (permanent image in chart),,,,  Motor weakness within 5 minutes.   Nerve Stimulator or Paresthesia:  Response: 0.5 mA,   Additional Responses:   Narrative:  Start time: 06/23/2017 10:56 AM End time: 06/23/2017 10:59 AM Injection made incrementally with aspirations every 5 mL.  Performed by: Personally  Anesthesiologist: Lewie LoronGermeroth, Brendaliz Kuk, MD  Additional Notes: Nerve located and needle positioned with direct ultrasound guidance. Good perineural spread. Patient tolerated well.

## 2017-06-23 NOTE — Progress Notes (Signed)
Assisted Dr. Germeroth with right, ultrasound guided, popliteal block. Side rails up, monitors on throughout procedure. See vital signs in flow sheet. Tolerated Procedure well. 

## 2017-06-23 NOTE — H&P (Signed)
    PREOPERATIVE H&P  Chief Complaint: right trimalleolar ankle fracture  HPI: Benjamin Henderson is a 47 y.o. male who presents for surgical treatment of right trimalleolar ankle fracture.  He denies any changes in medical history.  Past Medical History:  Diagnosis Date  . Diabetes mellitus without complication (HCC)   . Hypertension    No past surgical history on file. Social History   Socioeconomic History  . Marital status: Single    Spouse name: Not on file  . Number of children: Not on file  . Years of education: Not on file  . Highest education level: Not on file  Social Needs  . Financial resource strain: Not on file  . Food insecurity - worry: Not on file  . Food insecurity - inability: Not on file  . Transportation needs - medical: Not on file  . Transportation needs - non-medical: Not on file  Occupational History  . Not on file  Tobacco Use  . Smoking status: Never Smoker  . Smokeless tobacco: Never Used  Substance and Sexual Activity  . Alcohol use: No    Frequency: Never  . Drug use: No  . Sexual activity: Not on file  Other Topics Concern  . Not on file  Social History Narrative  . Not on file   No family history on file. No Known Allergies Prior to Admission medications   Medication Sig Start Date End Date Taking? Authorizing Provider  amLODipine (NORVASC) 10 MG tablet Take 10 mg by mouth daily. 06/11/17   [provider]  HYDROcodone-acetaminophen (NORCO/VICODIN) 5-325 MG tablet Take 1-2 tablets by mouth every 6 (six) hours as needed for moderate pain. 06/18/17   Cathren LaineSteinl, Kevin, MD  lisinopril-hydrochlorothiazide (PRINZIDE,ZESTORETIC) 20-25 MG tablet Take 1 tablet by mouth at bedtime.  06/11/17   [provider]  metFORMIN (GLUCOPHAGE) 500 MG tablet Take 500 mg by mouth 2 (two) times daily with a meal.  05/07/17   [provider]  pravastatin (PRAVACHOL) 40 MG tablet Take 40 mg by mouth daily. 06/11/17   [provider]    Vitamin D, Ergocalciferol, (DRISDOL) 50000 units CAPS capsule Take 50,000 Units by mouth every Friday.    [provider]     Positive ROS: All other systems have been reviewed and were otherwise negative with the exception of those mentioned in the HPI and as above.  Physical Exam: General: Alert, no acute distress Cardiovascular: No pedal edema Respiratory: No cyanosis, no use of accessory musculature GI: abdomen soft Skin: No lesions in the area of chief complaint Neurologic: Sensation intact distally Psychiatric: Patient is competent for consent with normal mood and affect Lymphatic: no lymphedema  MUSCULOSKELETAL: exam stable  Assessment: right trimalleolar ankle fracture  Plan: Plan for Procedure(s): OPEN REDUCTION INTERNAL FIXATION (ORIF) RIGHT TRIMALLEOLAR ANKLE FRACTURE  The risks benefits and alternatives were discussed with the patient including but not limited to the risks of nonoperative treatment, versus surgical intervention including infection, bleeding, nerve injury,  blood clots, cardiopulmonary complications, morbidity, mortality, among others, and they were willing to proceed.   Glee ArvinMichael , MD   06/23/2017 8:25 AM

## 2017-06-23 NOTE — Anesthesia Procedure Notes (Signed)
Procedure Name: LMA Insertion Date/Time: 06/23/2017 12:35 PM Performed by: Gar GibbonKeeton, Anmarie Fukushima S, CRNA Pre-anesthesia Checklist: Patient identified, Emergency Drugs available, Suction available and Patient being monitored Patient Re-evaluated:Patient Re-evaluated prior to induction Oxygen Delivery Method: Circle system utilized Preoxygenation: Pre-oxygenation with 100% oxygen Induction Type: IV induction Ventilation: Mask ventilation without difficulty LMA: LMA inserted LMA Size: 5.0 Number of attempts: 1 Airway Equipment and Method: Bite block Placement Confirmation: positive ETCO2 Tube secured with: Tape Dental Injury: Teeth and Oropharynx as per pre-operative assessment

## 2017-06-24 ENCOUNTER — Encounter (HOSPITAL_BASED_OUTPATIENT_CLINIC_OR_DEPARTMENT_OTHER): Payer: Self-pay | Admitting: Orthopaedic Surgery

## 2017-07-08 ENCOUNTER — Encounter (INDEPENDENT_AMBULATORY_CARE_PROVIDER_SITE_OTHER): Payer: Self-pay | Admitting: Orthopaedic Surgery

## 2017-07-08 ENCOUNTER — Ambulatory Visit (INDEPENDENT_AMBULATORY_CARE_PROVIDER_SITE_OTHER): Payer: Managed Care, Other (non HMO) | Admitting: Orthopaedic Surgery

## 2017-07-08 ENCOUNTER — Ambulatory Visit (INDEPENDENT_AMBULATORY_CARE_PROVIDER_SITE_OTHER): Payer: Managed Care, Other (non HMO)

## 2017-07-08 DIAGNOSIS — S82851D Displaced trimalleolar fracture of right lower leg, subsequent encounter for closed fracture with routine healing: Secondary | ICD-10-CM

## 2017-07-08 MED ORDER — TIZANIDINE HCL 4 MG PO TABS: 4.0000 mg | ORAL_TABLET | Freq: Four times a day (QID) | ORAL | 2 refills | Status: DC | PRN

## 2017-07-08 MED ORDER — CALCIUM CARBONATE-VITAMIN D 500-200 MG-UNIT PO TABS: 1.0000 | ORAL_TABLET | Freq: Three times a day (TID) | ORAL | 12 refills | Status: AC

## 2017-07-08 MED ORDER — HYDROCODONE-ACETAMINOPHEN 7.5-325 MG PO TABS: 1.0000 | ORAL_TABLET | Freq: Four times a day (QID) | ORAL | 0 refills | Status: AC | PRN

## 2017-07-08 NOTE — Progress Notes (Signed)
Patient is 2 weeks status post ORIF right trimalleolar ankle fracture.  He is overall doing well.  Pain is well controlled.  Surgical incision is healed without any signs of infection or drainage.  He has expected swelling.  His calf is nontender.  He is neurovascular.  Sutures removed today.  Cam walker was applied today.  Continue for 4 more weeks.  Questions encouraged and answered.  Follow-up in 4 weeks with three-view x-rays of the right ankle.  Norco and Zanaflex refilled today.  May discontinue aspirin at this point.

## 2017-07-09 ENCOUNTER — Encounter (INDEPENDENT_AMBULATORY_CARE_PROVIDER_SITE_OTHER): Payer: Self-pay

## 2017-07-09 ENCOUNTER — Telehealth (INDEPENDENT_AMBULATORY_CARE_PROVIDER_SITE_OTHER): Payer: Self-pay

## 2017-07-09 NOTE — Telephone Encounter (Signed)
FMLA Paperwork was faxed to [employer] FAX # [phone] Sent a copy to get it scanned to patients chart.

## 2017-07-15 ENCOUNTER — Telehealth (INDEPENDENT_AMBULATORY_CARE_PROVIDER_SITE_OTHER): Payer: Self-pay | Admitting: Orthopaedic Surgery

## 2017-07-15 NOTE — Telephone Encounter (Signed)
[name] with [employer] called questioning the form that was filled out by Dr. Roda ShuttersXu on 01/18. He completed surgery (ORIF right ankle) on Benjamin Henderson 01/02. Benjamin Henderson was seen on the 17th in office and was told to be non weight bearing for 12 weeks which would put him 3 months out but the form said for Benjamin Henderson to return to work 02/27. She just wants to make sure this is correct and what Dr. Roda ShuttersXu wants before she processes the claim. She would like a call back, # [phone]

## 2017-07-15 NOTE — Telephone Encounter (Signed)
NWB for 6 wks.  May return to work at 8-10 weeks if there is modified duty.

## 2017-07-15 NOTE — Telephone Encounter (Signed)
See Message below

## 2017-07-16 NOTE — Telephone Encounter (Signed)
Holding for you to call Monday.

## 2017-07-19 ENCOUNTER — Encounter (INDEPENDENT_AMBULATORY_CARE_PROVIDER_SITE_OTHER): Payer: Self-pay

## 2017-07-19 NOTE — Telephone Encounter (Signed)
Called Maurine MinisterLilian back and she would like note faxed to her  [phone] fax number

## 2017-07-19 NOTE — Telephone Encounter (Signed)
Note made.  

## 2017-08-05 ENCOUNTER — Ambulatory Visit (INDEPENDENT_AMBULATORY_CARE_PROVIDER_SITE_OTHER): Payer: Managed Care, Other (non HMO)

## 2017-08-05 ENCOUNTER — Ambulatory Visit (INDEPENDENT_AMBULATORY_CARE_PROVIDER_SITE_OTHER): Payer: Managed Care, Other (non HMO) | Admitting: Orthopaedic Surgery

## 2017-08-05 DIAGNOSIS — S82851D Displaced trimalleolar fracture of right lower leg, subsequent encounter for closed fracture with routine healing: Secondary | ICD-10-CM

## 2017-08-05 NOTE — Progress Notes (Signed)
Patient is 6 weeks status post ORIF right trimalleolar ankle fracture.  Overall he is doing well.  He does not stiffness.  Surgical scar is fully healed.  He has the expected postoperative swelling.  X-rays show stable fixation and alignment of fracture.  No complications.  At this point would not allow him to weight-bear as tolerated in a Cam walker.  Referral for physical therapy for gait training and strengthening.  Out of work for another 6 weeks that he is not able to perform his regular duties and he has not yet cleared to drive.  Follow-up with three-view x-rays of the right ankle on return.

## 2017-09-06 ENCOUNTER — Telehealth (INDEPENDENT_AMBULATORY_CARE_PROVIDER_SITE_OTHER): Payer: Self-pay | Admitting: Orthopaedic Surgery

## 2017-09-06 ENCOUNTER — Other Ambulatory Visit (INDEPENDENT_AMBULATORY_CARE_PROVIDER_SITE_OTHER): Payer: Self-pay | Admitting: Physician Assistant

## 2017-09-06 NOTE — Telephone Encounter (Signed)
I don't see where we have done this?

## 2017-09-06 NOTE — Telephone Encounter (Signed)
See message below °

## 2017-09-06 NOTE — Telephone Encounter (Signed)
Tried to call patient no answer. LMOM 

## 2017-09-06 NOTE — Telephone Encounter (Signed)
Med refill  Vitamin C shot send to pharmacy on file

## 2017-09-16 ENCOUNTER — Encounter (INDEPENDENT_AMBULATORY_CARE_PROVIDER_SITE_OTHER): Payer: Self-pay | Admitting: Orthopaedic Surgery

## 2017-09-16 ENCOUNTER — Ambulatory Visit (INDEPENDENT_AMBULATORY_CARE_PROVIDER_SITE_OTHER): Payer: Managed Care, Other (non HMO) | Admitting: Orthopaedic Surgery

## 2017-09-16 ENCOUNTER — Telehealth (INDEPENDENT_AMBULATORY_CARE_PROVIDER_SITE_OTHER): Payer: Self-pay | Admitting: Orthopaedic Surgery

## 2017-09-16 ENCOUNTER — Ambulatory Visit (INDEPENDENT_AMBULATORY_CARE_PROVIDER_SITE_OTHER): Payer: Managed Care, Other (non HMO)

## 2017-09-16 DIAGNOSIS — S82851D Displaced trimalleolar fracture of right lower leg, subsequent encounter for closed fracture with routine healing: Secondary | ICD-10-CM | POA: Diagnosis not present

## 2017-09-16 NOTE — Telephone Encounter (Signed)
[facility]  [phone] office  [phone] fax  Suzzanne CloudKrish Chinnasamy    [facility] is requesting all pt Progress note since he started care with [facility].

## 2017-09-16 NOTE — Telephone Encounter (Signed)
faxed

## 2017-09-16 NOTE — Progress Notes (Signed)
   Post-Op Visit Note   Patient: Benjamin Henderson           Date of Birth: 10/02/1970           MRN: [id number] Visit Date: 09/16/2017 PCP: Patient, No Pcp Per   Assessment & Plan:  Chief Complaint:  Chief Complaint  Patient presents with  . Right Ankle - Follow-up   Visit Diagnoses:  1. Closed displaced trimalleolar fracture of right lower leg with routine healing     Plan: Patient is a pleasant 47 year old gentleman who presents our clinic today 85 days status post ORIF right trimalleolar ankle fracture, date of surgery 06/23/2017.  He has been in outpatient physical therapy over the past month making slow progress with motion.  He has been weightbearing as tolerated in a Cam Walker utilizing one crutch with ambulation.  He has minimal pain to the medial malleolus but nothing more.  He still has moderate swelling which goes down in the morning after he has been elevating while sleeping.  No fevers chills or any other systemic symptoms.  At this point, we will transition him out of the Cam walker into an ASO.  Weightbearing as tolerated.  We will continue with outpatient physical therapy to continue working on motion, strengthening and stabilization.  We will also write a work note to have him return to work on 09/29/2017 light duty, no lifting more than 15 pounds, no climbing, pushing or pulling.  He will follow-up with us in 6 weeks time for repeat evaluation.  He will call with concerns or questions in the meantime.  Follow-Up Instructions: Return in about 6 weeks (around 10/28/2017).   Orders:  Orders Placed This Encounter  Procedures  . XR Ankle Complete Right   No orders of the defined types were placed in this encounter.   Imaging: Xr Ankle Complete Right  Result Date: 09/16/2017 X-rays show well healing fracture with stable alignment   PMFS History: Patient Active Problem List   Diagnosis Date Noted  . Closed displaced trimalleolar fracture of right lower leg with routine  healing 06/20/2017   Past Medical History:  Diagnosis Date  . Diabetes mellitus without complication (HCC)   . Hypertension     History reviewed. No pertinent family history.  Past Surgical History:  Procedure Laterality Date  . ORIF ANKLE FRACTURE Right 06/23/2017   Procedure: OPEN REDUCTION INTERNAL FIXATION (ORIF) RIGHT TRIMALLEOLAR ANKLE FRACTURE;  Surgeon: Tarry KosXu, Naiping M, MD;  Location: [facility];  Service: Orthopedics;  Laterality: Right;   Social History   Occupational History  . Not on file  Tobacco Use  . Smoking status: Never Smoker  . Smokeless tobacco: Never Used  Substance and Sexual Activity  . Alcohol use: No    Frequency: Never  . Drug use: No  . Sexual activity: Not on file

## 2017-10-28 ENCOUNTER — Encounter (INDEPENDENT_AMBULATORY_CARE_PROVIDER_SITE_OTHER): Payer: Self-pay | Admitting: Orthopaedic Surgery

## 2017-10-28 ENCOUNTER — Ambulatory Visit (INDEPENDENT_AMBULATORY_CARE_PROVIDER_SITE_OTHER): Payer: Managed Care, Other (non HMO) | Admitting: Orthopaedic Surgery

## 2017-10-28 ENCOUNTER — Ambulatory Visit (INDEPENDENT_AMBULATORY_CARE_PROVIDER_SITE_OTHER): Payer: Managed Care, Other (non HMO)

## 2017-10-28 DIAGNOSIS — S82851D Displaced trimalleolar fracture of right lower leg, subsequent encounter for closed fracture with routine healing: Secondary | ICD-10-CM | POA: Diagnosis not present

## 2017-10-28 NOTE — Progress Notes (Signed)
   Office Visit Note   Patient: Benjamin Henderson           Date of Birth: [birth year]           MRN: [id number] Visit Date: 10/28/2017              Requested by: No referring provider defined for this encounter. PCP: Patient, No Pcp Per   Assessment & Plan: Visit Diagnoses:  1. Closed displaced trimalleolar fracture of right lower leg with routine healing     Plan: At this point patient is 4 months status post ORIF right ankle fracture.  He is doing well.  I think he is appropriate to return back to full duty.  Work note provided today.  ASO brace as needed.  Questions encouraged and answered.  Follow-up as needed.  Follow-Up Instructions: Return if symptoms worsen or fail to improve.   Orders:  Orders Placed This Encounter  Procedures  . XR Ankle Complete Right   No orders of the defined types were placed in this encounter.     Procedures: No procedures performed   Clinical Data: No additional findings.   Subjective: Chief Complaint  Patient presents with  . Right Ankle - Follow-up    S/p ORIF right trimalleolar fx 06/23/17    Patient is 4 months status post ORIF right trimalleolar ankle fracture.  He is doing well has completed physical therapy.  He feels that he is ready to return to full duty.  He does endorse some swelling that is worse at the end of the day.   Review of Systems   Objective: Vital Signs: There were no vitals taken for this visit.  Physical Exam  Ortho Exam Right ankle exam shows a fully healed surgical scar.  Mild swelling.  Good range of motion and strength. Specialty Comments:  No specialty comments available.  Imaging: Xr Ankle Complete Right  Result Date: 10/28/2017 Healed lateral malleolus fracture.  Calcification of syndesmosis    PMFS History: Patient Active Problem List   Diagnosis Date Noted  . Closed displaced trimalleolar fracture of right lower leg with routine healing 06/20/2017   Past Medical History:  Diagnosis Date    . Diabetes mellitus without complication (HCC)   . Hypertension     No family history on file.  Past Surgical History:  Procedure Laterality Date  . ORIF ANKLE FRACTURE Right 06/23/2017   Procedure: OPEN REDUCTION INTERNAL FIXATION (ORIF) RIGHT TRIMALLEOLAR ANKLE FRACTURE;  Surgeon: Benjamin Kos, MD;  Location: [facility];  Service: Orthopedics;  Laterality: Right;   Social History   Occupational History  . Not on file  Tobacco Use  . Smoking status: Never Smoker  . Smokeless tobacco: Never Used  Substance and Sexual Activity  . Alcohol use: No    Frequency: Never  . Drug use: No  . Sexual activity: Not on file

## 2018-02-27 ENCOUNTER — Other Ambulatory Visit (INDEPENDENT_AMBULATORY_CARE_PROVIDER_SITE_OTHER): Payer: Self-pay | Admitting: Orthopaedic Surgery

## 2019-01-07 IMAGING — CR DG ANKLE COMPLETE 3+V*R*
3 series · 3 of 3 positions shown · non-contrast
Comparison: None.

CLINICAL DATA: Pain following fall

EXAM:
RIGHT ANKLE - COMPLETE 3+ VIEW

[x ankle lat right (1 of 3)]
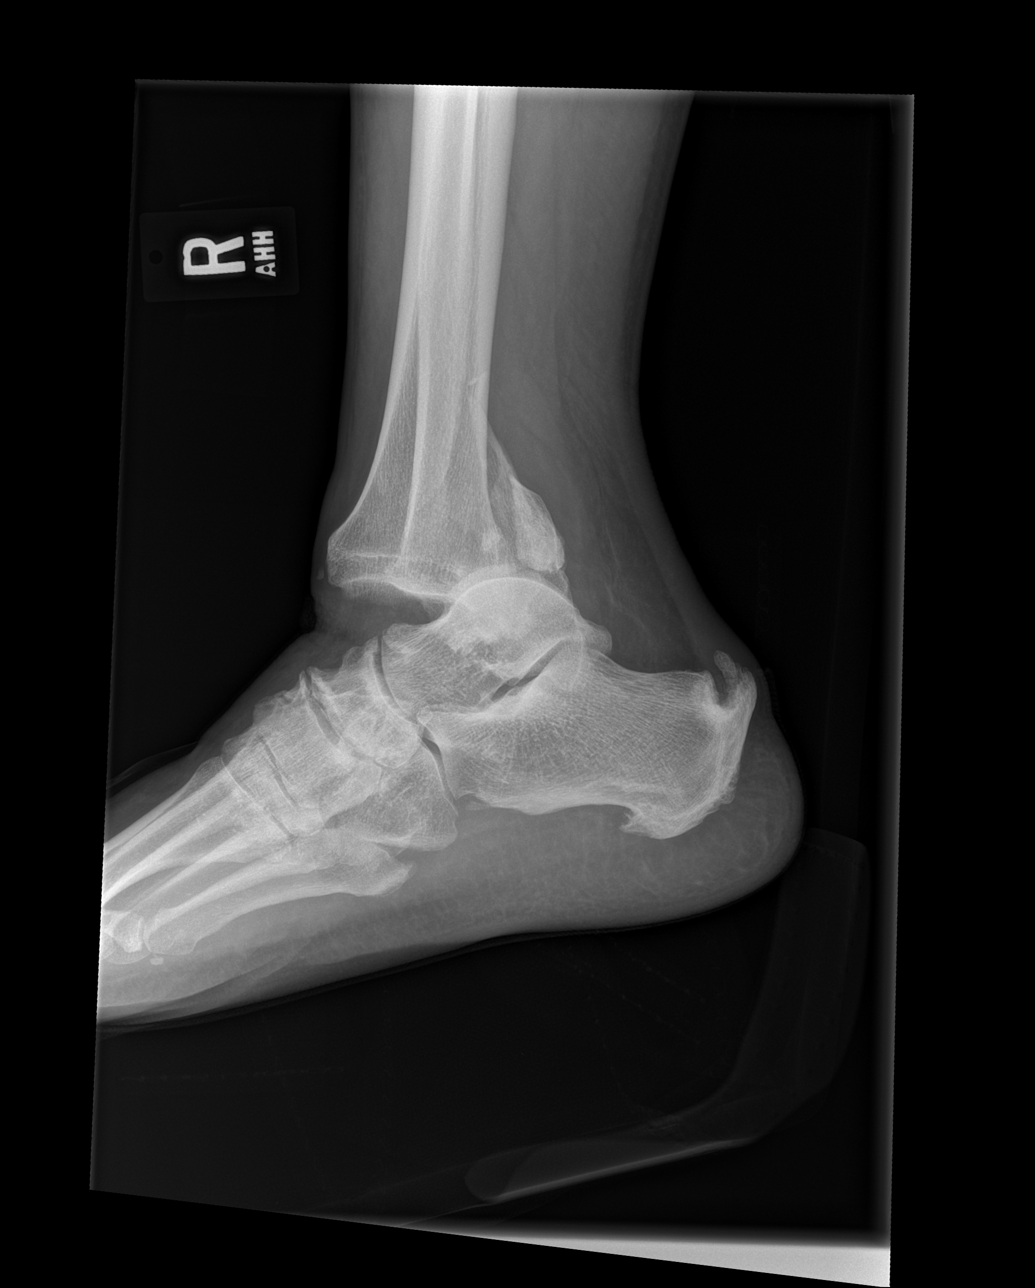

[x ankle lat right (2 of 3)]
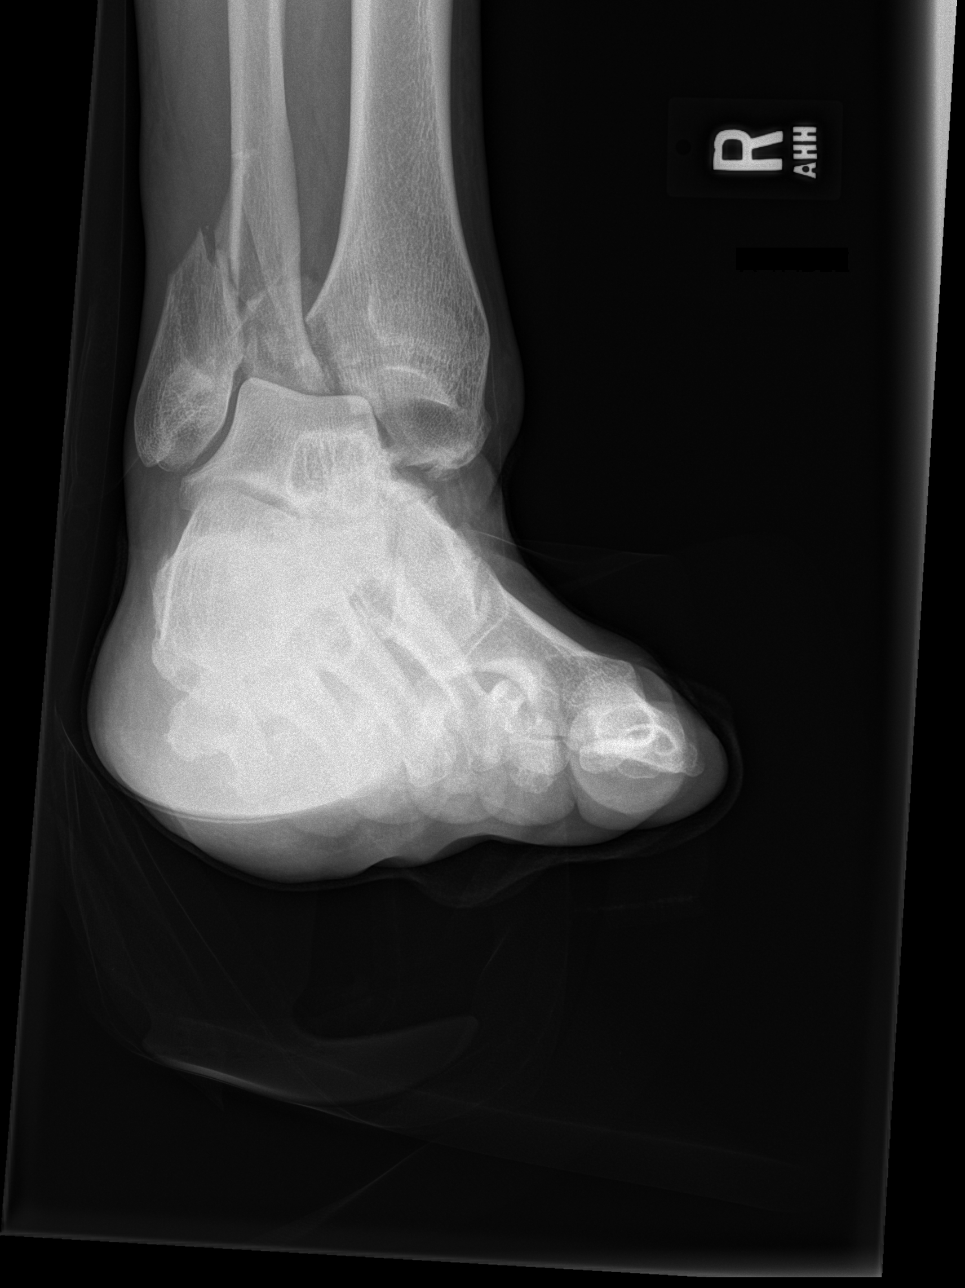

[x ankle lat right (3 of 3)]
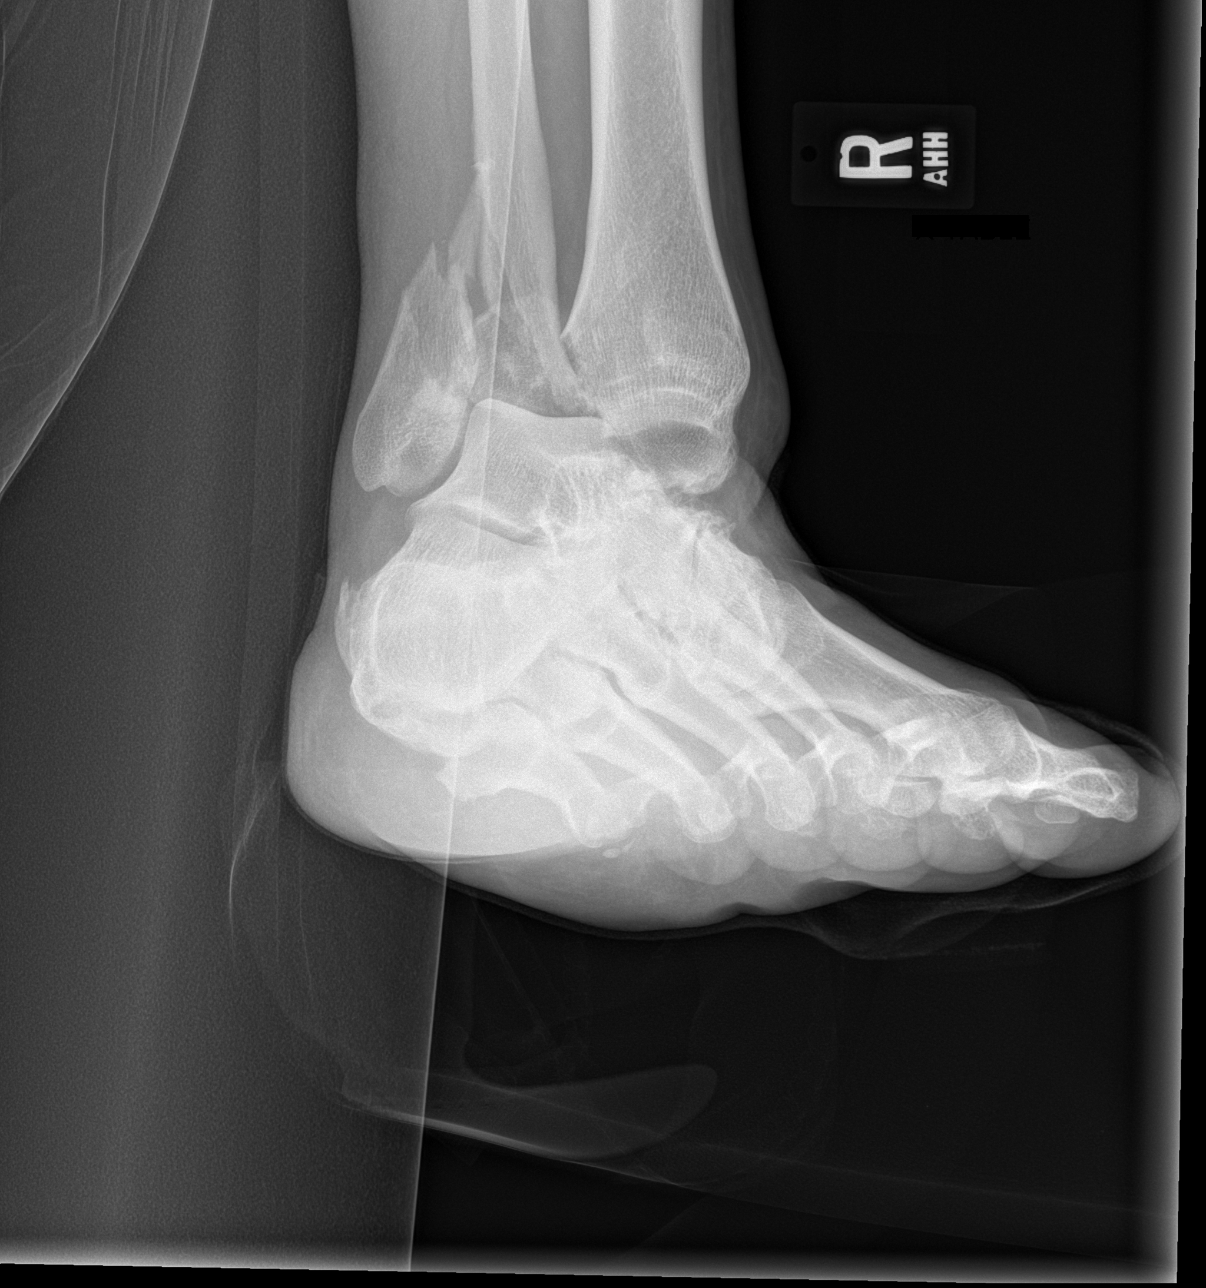

[3 of 3 positions shown; findings below may reference images not displayed]

FINDINGS: Frontal, oblique, and lateral views were obtained. There is gross
disruption of the ankle mortise with the foot displaced posterior
and lateral to the tibial plafond. There is a comminuted fracture of
the distal fibular diaphysis with lateral displacement and posterior
angulation of the distal major fracture fragment with respect
proximal fragment. There is avulsion along the medial malleolus.

There are posterior inferior calcaneal spurs. There is spurring in
the dorsal midfoot region.
IMPRESSION: Comminuted fracture of the distal fibula with displacement
angulation of distal major fragments. Avulsion medial malleolus.
Gross ankle mortise disruption. Generalized osteoarthritic change in
the dorsal midfoot as well as prominent calcaneal spurs noted.

## 2019-01-07 IMAGING — CT CT ANKLE*R* W/O CM
3 series · 12 of 33 positions shown, 14 images · non-contrast
Comparison: Radiographs same date.

CLINICAL DATA: Ankle fracture dislocation post closed reduction.

EXAM:
CT OF THE RIGHT ANKLE WITHOUT CONTRAST
TECHNIQUE: Multidetector CT imaging of the right ankle was performed according
to the standard protocol. Multiplanar CT image reconstructions were
also generated.

[Series 4: axial st · axial · 0.35mm/px · z∈[-904,-756]mm · 4 of 144 slices shown, 5 images]
[im 23/144  soft-tissue]
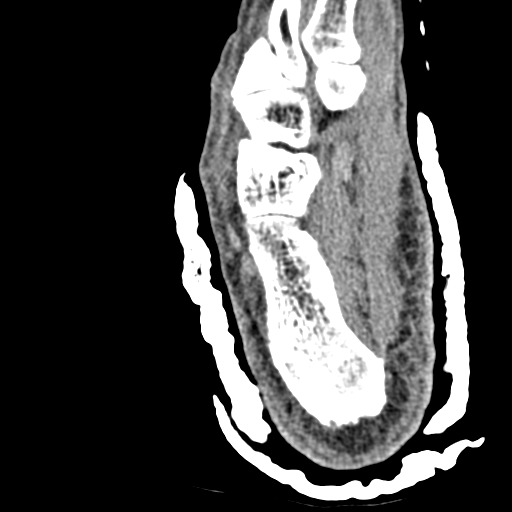
[im 23/144  bone]
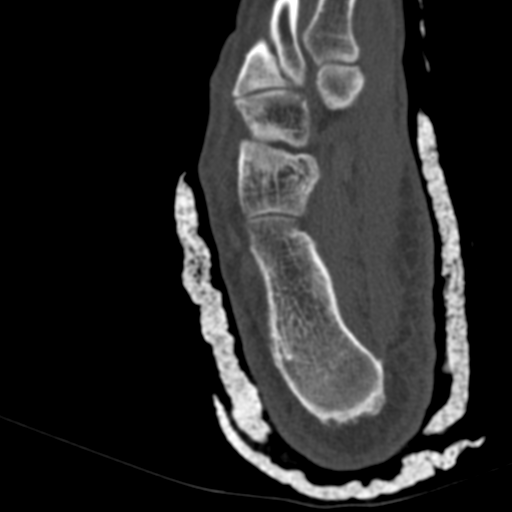
[im 56/144  bone]
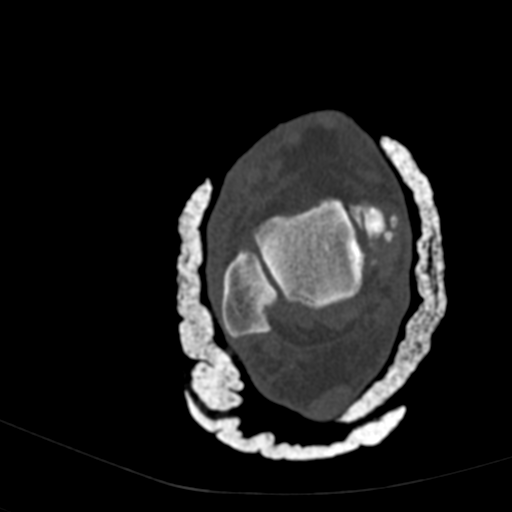
[im 89/144  bone]
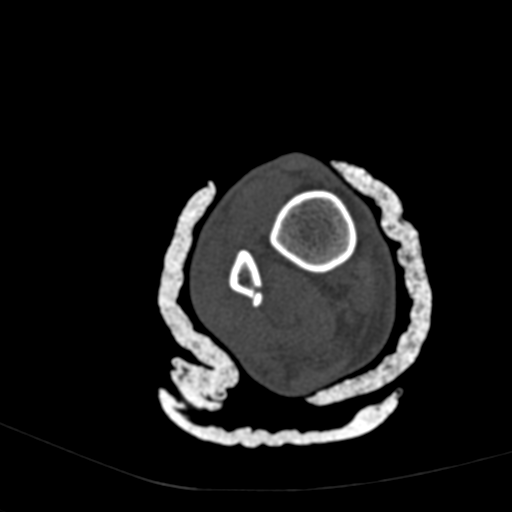
[im 122/144  bone]
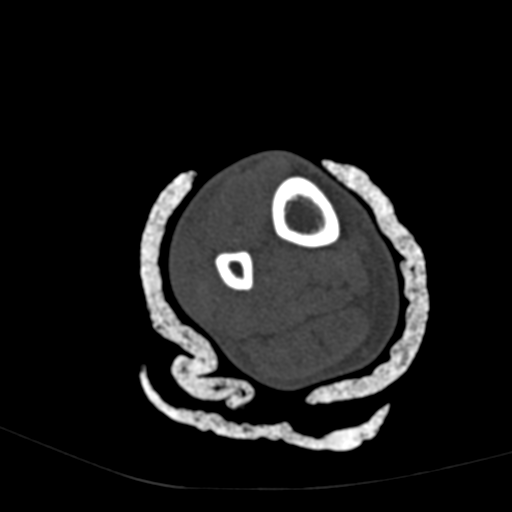

[Series 9: coronal st · coronal · 0.28mm/px · 3 of 99 slices shown]
[im 20/99  bone]
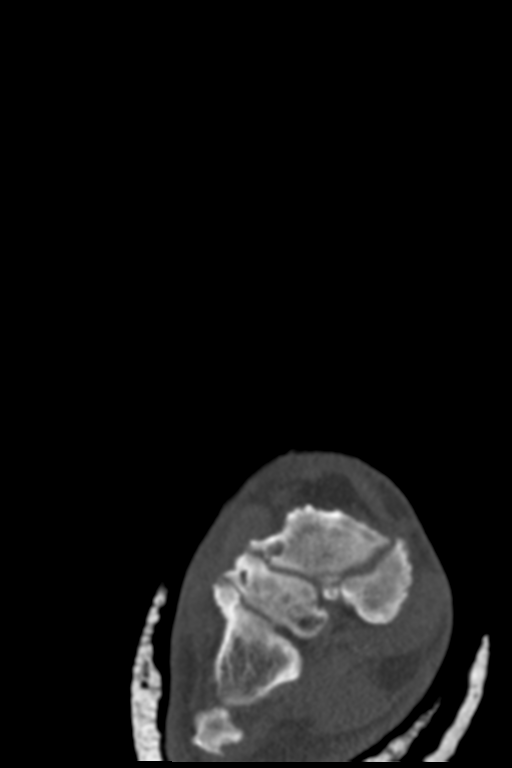
[im 40/99  bone]
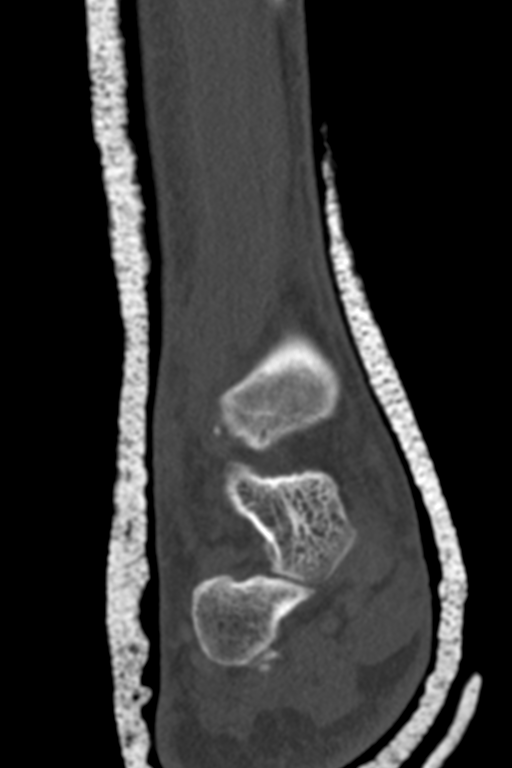
[im 59/99  bone]
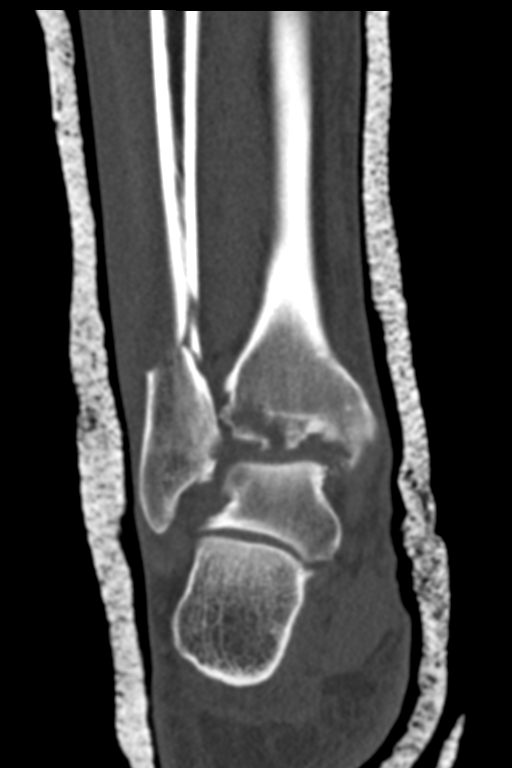

[Series 10: sagittal st · sagittal · 0.29mm/px · 5 of 97 slices shown, 6 images]
[im 33/97  bone]
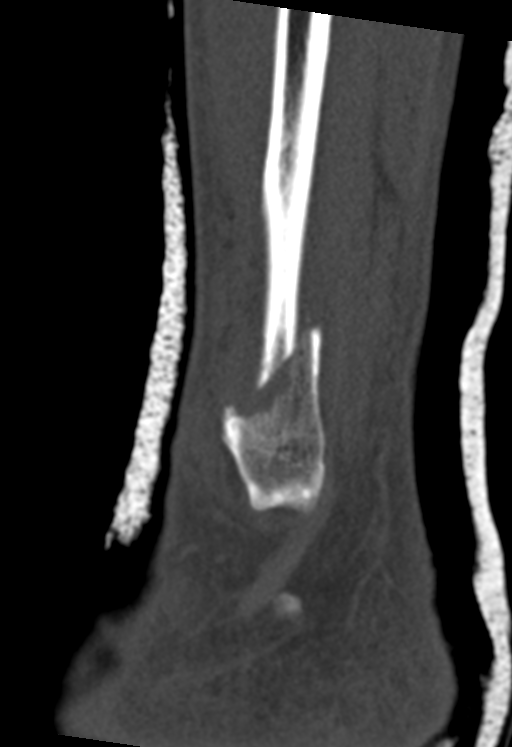
[im 41/97  bone]
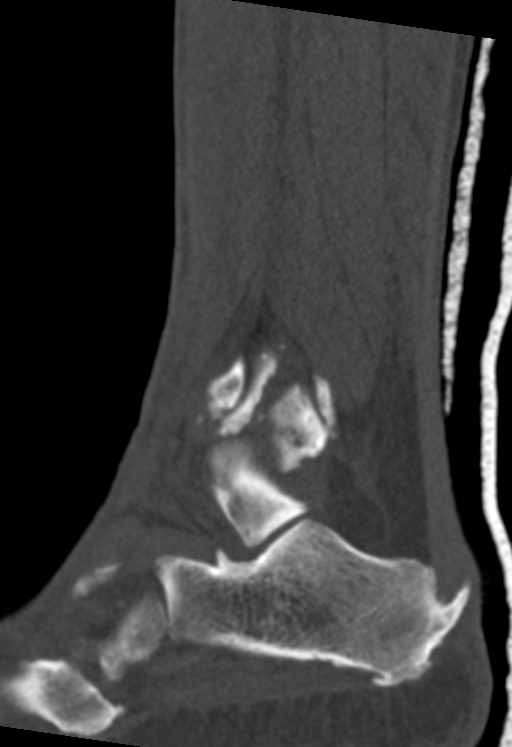
[im 49/97  soft-tissue]
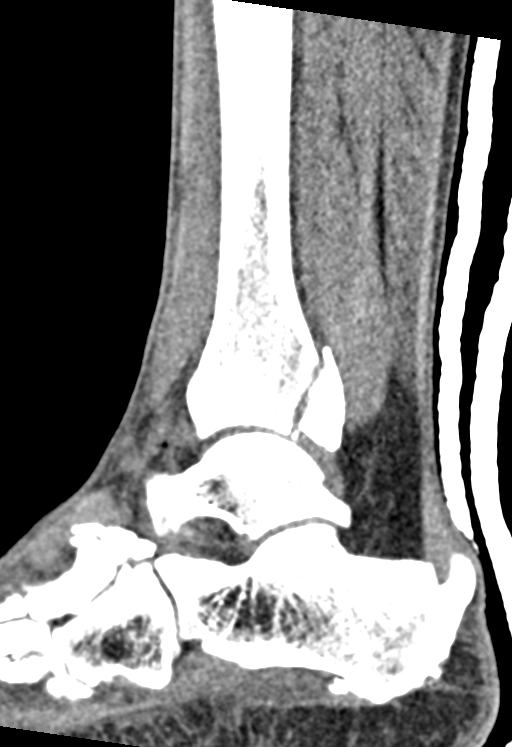
[im 49/97  bone]
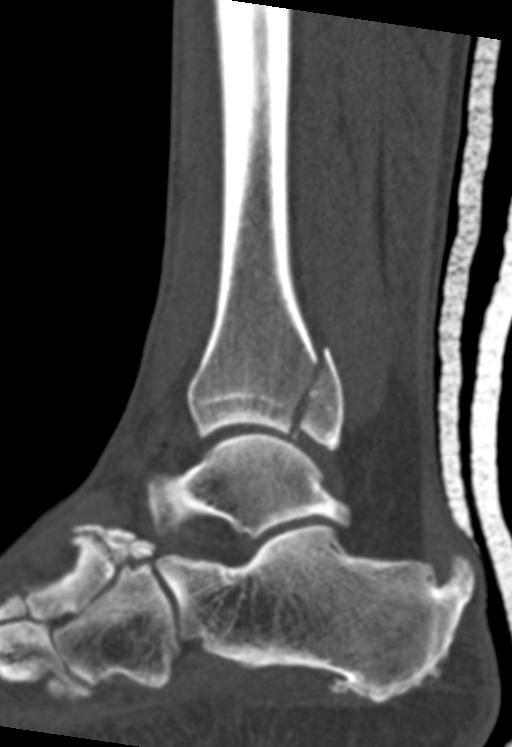
[im 57/97  bone]
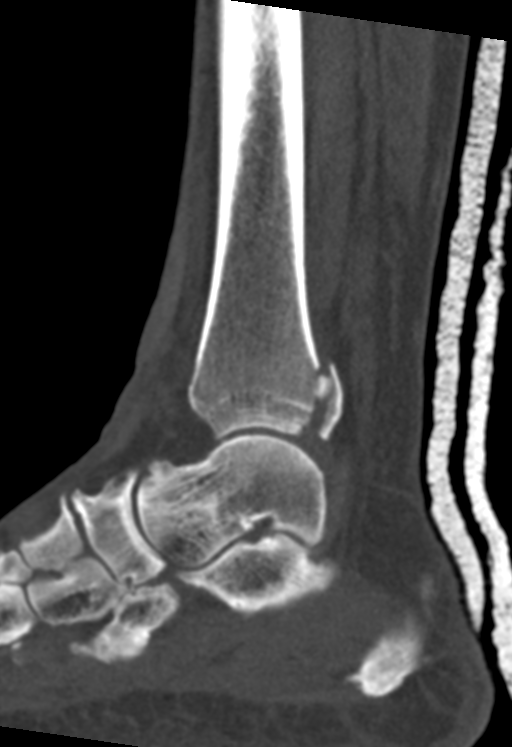
[im 65/97  bone]
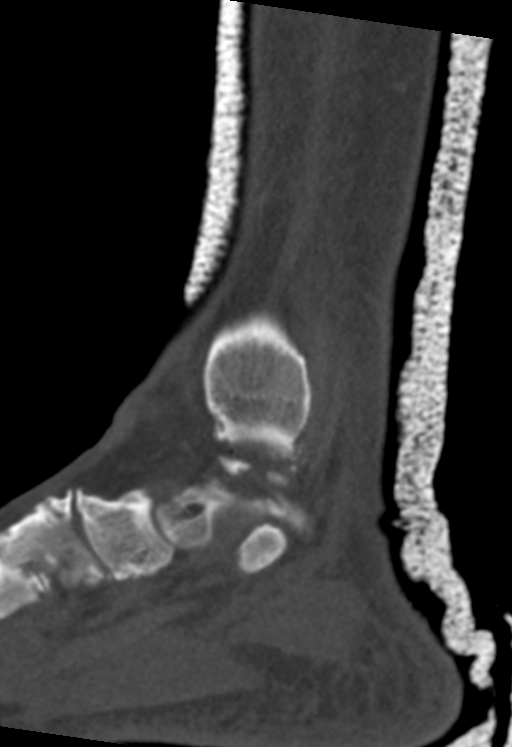

[12 of 33 positions shown; findings below may reference images not displayed]

FINDINGS: Bones/Joint/Cartilage

The ankle remains splinted. There is a comminuted and minimally
displaced fracture of the distal fibula extending superiorly from
the ankle mortise by 5.2 cm. This demonstrates up to 5 mm of
posterior displacement.

There is a mildly comminuted intra-articular fracture of the tibial
plafond posteriorly. This demonstrates up to 7 mm of posterior
displacement medially and up to 4 mm of impaction of the articular
surface posteriorly (sagittal image 53). There is spurring of the
medial malleolus without definite acute fracture.

The tibiotalar dislocation has been reduced. No tarsal bone
fractures are seen. There are several small fracture fragments in
the tibiotalar joint. There is a small ankle joint effusion. Midfoot
degenerative changes are present, greatest at the articulation
between the cuboid and lateral malleolus.

Ligaments

Suboptimally evaluated by CT. Possible small avulsion fractures
mediated by the anterior inferior tibiofibular ligament.

Muscles and Tendons

The ankle tendons appear intact without evidence of entrapment in
the fractures. Moderate posterior calcaneal spurring at the Achilles
insertion.

Soft tissues

Mild soft tissue swelling around the fractures, extending into the
foot.
IMPRESSION: 1. Near anatomic reduction of comminuted fracture of the distal
fibula and posterior aspect of the tibial plafond as described.
Several fracture fragments are present in the tibiotalar joint.
2. No evidence of tarsal bone fracture.
3. Moderate midfoot degenerative changes.
